# Patient Record
Sex: Female | Born: 1970 | Race: White | Hispanic: No | Marital: Married | Smoking: Never smoker
Health system: Southern US, Community
[De-identification: ages and names within clinical notes are randomized; demographics above are authoritative.]

## PROBLEM LIST (undated history)

## (undated) DIAGNOSIS — M1990 Unspecified osteoarthritis, unspecified site: Secondary | ICD-10-CM

## (undated) DIAGNOSIS — Z973 Presence of spectacles and contact lenses: Secondary | ICD-10-CM

## (undated) DIAGNOSIS — T8859XA Other complications of anesthesia, initial encounter: Secondary | ICD-10-CM

## (undated) DIAGNOSIS — I82409 Acute embolism and thrombosis of unspecified deep veins of unspecified lower extremity: Secondary | ICD-10-CM

## (undated) HISTORY — PX: GALLBLADDER SURGERY: SHX652

## (undated) HISTORY — PX: CHOLECYSTECTOMY: SHX55

---

## 2000-02-24 HISTORY — PX: VEIN REPAIR: SHX6524

## 2008-12-20 ENCOUNTER — Ambulatory Visit: Payer: Self-pay

## 2010-11-13 ENCOUNTER — Ambulatory Visit: Payer: Self-pay | Admitting: Internal Medicine

## 2010-11-19 ENCOUNTER — Ambulatory Visit: Payer: Self-pay | Admitting: Surgery

## 2011-01-21 ENCOUNTER — Ambulatory Visit: Payer: Self-pay | Admitting: Anesthesiology

## 2011-01-29 ENCOUNTER — Ambulatory Visit: Payer: Self-pay | Admitting: Otolaryngology

## 2011-01-29 HISTORY — PX: SEPTOPLASTY: SUR1290

## 2012-03-11 ENCOUNTER — Ambulatory Visit: Payer: Self-pay

## 2013-03-17 ENCOUNTER — Ambulatory Visit: Payer: Self-pay

## 2014-03-27 ENCOUNTER — Ambulatory Visit: Payer: Self-pay

## 2014-12-13 DIAGNOSIS — Z3041 Encounter for surveillance of contraceptive pills: Secondary | ICD-10-CM | POA: Insufficient documentation

## 2014-12-13 DIAGNOSIS — Z1211 Encounter for screening for malignant neoplasm of colon: Secondary | ICD-10-CM | POA: Insufficient documentation

## 2014-12-13 DIAGNOSIS — R635 Abnormal weight gain: Secondary | ICD-10-CM | POA: Insufficient documentation

## 2014-12-13 DIAGNOSIS — Z01419 Encounter for gynecological examination (general) (routine) without abnormal findings: Secondary | ICD-10-CM | POA: Insufficient documentation

## 2014-12-13 DIAGNOSIS — N63 Unspecified lump in unspecified breast: Secondary | ICD-10-CM | POA: Insufficient documentation

## 2014-12-24 ENCOUNTER — Ambulatory Visit: Payer: Self-pay | Attending: Oncology | Admitting: *Deleted

## 2014-12-24 ENCOUNTER — Ambulatory Visit
Admission: RE | Admit: 2014-12-24 | Discharge: 2014-12-24 | Disposition: A | Discharge status: Home / Self Care | Payer: Self-pay | Source: Ambulatory Visit | Attending: Oncology | Admitting: Oncology

## 2014-12-24 ENCOUNTER — Encounter: Payer: Self-pay | Admitting: *Deleted

## 2014-12-24 VITALS — BP 129/80 | HR 83 | Temp 98.7°F | Resp 18 | Ht 67.72 in | Wt 151.0 lb

## 2014-12-24 DIAGNOSIS — N63 Unspecified lump in unspecified breast: Secondary | ICD-10-CM

## 2014-12-24 NOTE — Patient Instructions (Signed)
Gave patient hand-out, Women Staying Healthy, Active and Well from [facility], with education on breast health, pap smears, heart and colon health. 

## 2014-12-24 NOTE — Progress Notes (Signed)
Subjective:     Patient ID: Mia Church, female   DOB: 07/12/1970, 44 y.o.   MRN: [id number]  HPI   Review of Systems     Objective:   Physical Exam  Pulmonary/Chest: Right breast exhibits no inverted nipple, no mass, no nipple discharge, no skin change and no tenderness. Left breast exhibits mass. Left breast exhibits no inverted nipple, no nipple discharge, no skin change and no tenderness. Breasts are symmetrical.         Assessment:     44 year [facility] White [facility] a left breast mass.  Last mammogram on 03/27/14 was a birads 1.  It was noted that the patient has heterogeneously dense breast on mammogram.  On clinical breast exam there is noted an asymmetrical thickening at 1-2:00 left breast.  No family history of breast or ovarian cancer.  Patient states that maybe her great aunt had breast cancer in her 4990's.  Taught self breast awareness.  Patient has been screened for eligibility.  She does not have any Medicare or Medicaid, but has a 10,000 dollar deductable. She also meets financial eligibility.  Hand-out given on the Affordable Care Act.    Plan:     Will get uni left mammogram with ultrasound.  If no findings on imaging discussed with the patient the option to see a surgeon for a second opinion.  Will follow up per protocol.

## 2014-12-25 ENCOUNTER — Telehealth: Payer: Self-pay | Admitting: *Deleted

## 2014-12-25 NOTE — Telephone Encounter (Signed)
Talked to patient today to review her normal birads 1 mammogram.  Offered surgical consult, but the patient declined.  States she is comfortable with the radiology recommendation.  Encouraged  Her to call me back if she notices any changes or decides she would like further evaluation by a surgeon.  She is agreeable.

## 2014-12-26 ENCOUNTER — Other Ambulatory Visit: Payer: Self-pay | Admitting: Obstetrics and Gynecology

## 2014-12-26 ENCOUNTER — Ambulatory Visit: Payer: Self-pay

## 2014-12-26 DIAGNOSIS — Z1231 Encounter for screening mammogram for malignant neoplasm of breast: Secondary | ICD-10-CM

## 2015-02-28 ENCOUNTER — Ambulatory Visit
Admission: RE | Admit: 2015-02-28 | Discharge: 2015-02-28 | Disposition: A | Discharge status: Home / Self Care | Payer: BLUE CROSS/BLUE SHIELD | Source: Ambulatory Visit | Attending: Obstetrics and Gynecology | Admitting: Obstetrics and Gynecology

## 2015-02-28 DIAGNOSIS — Z1231 Encounter for screening mammogram for malignant neoplasm of breast: Secondary | ICD-10-CM

## 2016-02-26 ENCOUNTER — Other Ambulatory Visit: Payer: Self-pay | Admitting: Obstetrics and Gynecology

## 2016-02-26 DIAGNOSIS — Z1231 Encounter for screening mammogram for malignant neoplasm of breast: Secondary | ICD-10-CM

## 2016-03-30 ENCOUNTER — Ambulatory Visit
Admission: RE | Admit: 2016-03-30 | Discharge: 2016-03-30 | Disposition: A | Discharge status: Home / Self Care | Payer: BLUE CROSS/BLUE SHIELD | Source: Ambulatory Visit | Attending: Obstetrics and Gynecology | Admitting: Obstetrics and Gynecology

## 2016-03-30 DIAGNOSIS — Z1231 Encounter for screening mammogram for malignant neoplasm of breast: Secondary | ICD-10-CM | POA: Insufficient documentation

## 2016-10-12 ENCOUNTER — Other Ambulatory Visit: Payer: Self-pay | Admitting: Obstetrics and Gynecology

## 2016-10-12 DIAGNOSIS — Z1231 Encounter for screening mammogram for malignant neoplasm of breast: Secondary | ICD-10-CM

## 2017-02-25 ENCOUNTER — Ambulatory Visit
Admission: RE | Admit: 2017-02-25 | Discharge: 2017-02-25 | Disposition: A | Discharge status: Home / Self Care | Payer: BLUE CROSS/BLUE SHIELD | Source: Ambulatory Visit | Attending: Obstetrics and Gynecology | Admitting: Obstetrics and Gynecology

## 2017-02-25 DIAGNOSIS — Z1231 Encounter for screening mammogram for malignant neoplasm of breast: Secondary | ICD-10-CM | POA: Diagnosis not present

## 2017-11-25 ENCOUNTER — Other Ambulatory Visit: Payer: Self-pay | Admitting: Obstetrics and Gynecology

## 2017-11-25 DIAGNOSIS — Z1231 Encounter for screening mammogram for malignant neoplasm of breast: Secondary | ICD-10-CM

## 2018-02-24 ENCOUNTER — Ambulatory Visit
Admission: RE | Admit: 2018-02-24 | Discharge: 2018-02-24 | Disposition: A | Discharge status: Home / Self Care | Payer: BLUE CROSS/BLUE SHIELD | Source: Ambulatory Visit | Attending: Obstetrics and Gynecology | Admitting: Obstetrics and Gynecology

## 2018-02-24 DIAGNOSIS — Z1231 Encounter for screening mammogram for malignant neoplasm of breast: Secondary | ICD-10-CM | POA: Diagnosis present

## 2018-03-01 ENCOUNTER — Other Ambulatory Visit: Payer: Self-pay | Admitting: Obstetrics and Gynecology

## 2018-03-01 DIAGNOSIS — R928 Other abnormal and inconclusive findings on diagnostic imaging of breast: Secondary | ICD-10-CM

## 2018-03-01 DIAGNOSIS — N6489 Other specified disorders of breast: Secondary | ICD-10-CM

## 2018-03-03 ENCOUNTER — Ambulatory Visit
Admission: RE | Admit: 2018-03-03 | Discharge: 2018-03-03 | Disposition: A | Discharge status: Home / Self Care | Payer: BLUE CROSS/BLUE SHIELD | Source: Ambulatory Visit | Attending: Obstetrics and Gynecology | Admitting: Obstetrics and Gynecology

## 2018-03-03 DIAGNOSIS — R928 Other abnormal and inconclusive findings on diagnostic imaging of breast: Secondary | ICD-10-CM | POA: Insufficient documentation

## 2018-03-03 DIAGNOSIS — N6489 Other specified disorders of breast: Secondary | ICD-10-CM

## 2019-02-06 ENCOUNTER — Other Ambulatory Visit: Payer: Self-pay | Admitting: Obstetrics and Gynecology

## 2019-02-06 DIAGNOSIS — Z1231 Encounter for screening mammogram for malignant neoplasm of breast: Secondary | ICD-10-CM

## 2019-03-01 ENCOUNTER — Ambulatory Visit
Admission: RE | Admit: 2019-03-01 | Discharge: 2019-03-01 | Disposition: A | Discharge status: Home / Self Care | Payer: BC Managed Care – PPO | Source: Ambulatory Visit | Attending: Obstetrics and Gynecology | Admitting: Obstetrics and Gynecology

## 2019-03-01 DIAGNOSIS — Z1231 Encounter for screening mammogram for malignant neoplasm of breast: Secondary | ICD-10-CM | POA: Diagnosis present

## 2019-03-07 ENCOUNTER — Other Ambulatory Visit: Payer: Self-pay | Admitting: Obstetrics and Gynecology

## 2019-03-07 DIAGNOSIS — N6489 Other specified disorders of breast: Secondary | ICD-10-CM

## 2019-03-07 DIAGNOSIS — R928 Other abnormal and inconclusive findings on diagnostic imaging of breast: Secondary | ICD-10-CM

## 2019-03-10 ENCOUNTER — Ambulatory Visit
Admission: RE | Admit: 2019-03-10 | Discharge: 2019-03-10 | Disposition: A | Discharge status: Home / Self Care | Payer: BC Managed Care – PPO | Source: Ambulatory Visit | Attending: Obstetrics and Gynecology | Admitting: Obstetrics and Gynecology

## 2019-03-10 DIAGNOSIS — R928 Other abnormal and inconclusive findings on diagnostic imaging of breast: Secondary | ICD-10-CM | POA: Insufficient documentation

## 2019-03-10 DIAGNOSIS — N6489 Other specified disorders of breast: Secondary | ICD-10-CM | POA: Diagnosis present

## 2019-08-08 ENCOUNTER — Encounter: Payer: Self-pay | Admitting: Podiatry

## 2019-08-09 ENCOUNTER — Other Ambulatory Visit: Payer: Self-pay | Admitting: Podiatry

## 2019-08-15 ENCOUNTER — Other Ambulatory Visit: Payer: Self-pay

## 2019-08-15 ENCOUNTER — Other Ambulatory Visit
Admission: RE | Admit: 2019-08-15 | Discharge: 2019-08-15 | Disposition: A | Discharge status: Home / Self Care | Payer: BC Managed Care – PPO | Source: Ambulatory Visit | Attending: Podiatry | Admitting: Podiatry

## 2019-08-15 DIAGNOSIS — Z01812 Encounter for preprocedural laboratory examination: Secondary | ICD-10-CM | POA: Diagnosis not present

## 2019-08-15 DIAGNOSIS — Z20822 Contact with and (suspected) exposure to covid-19: Secondary | ICD-10-CM | POA: Diagnosis not present

## 2019-08-15 LAB — SARS CORONAVIRUS 2 (TAT 6-24 HRS): SARS Coronavirus 2: NEGATIVE

## 2019-08-15 NOTE — Discharge Instructions (Signed)
[facility]  POST OPERATIVE INSTRUCTIONS FOR DR. TROXLER, DR. FOWLER, AND DR. B[facility] PODIATRY DEPART[facility]   1. Take your medication as prescribed.  Pain medication should be taken only as needed.  2. Keep the dressing clean, dry and intact.  3. Keep your foot elevated above the heart level for the first 48 hours.  4. Walking to the bathroom and brief periods of walking are acceptable, unless we have instructed you to be non-weight bearing.  5. Always wear your post-op shoe when walking.  Always use your crutches if you are to be non-weight bearing.  6. Do not take a shower. Baths are permissible as long as the foot is kept out of the water.   7. Every hour you are awake:  - Bend your knee 15 times. - Flex foot 15 times - Massage calf 15 times  8. Call [facility] ([phone]) if any of the following problems occur: - You develop a temperature or fever. - The bandage becomes saturated with blood. - Medication does not stop your pain. - Injury of the foot occurs. - Any symptoms of infection including redness, odor, or red streaks running from wound.   General Anesthesia, Adult, Care After This sheet gives you information about how to care for yourself after your procedure. Your health care provider may also give you more specific instructions. If you have problems or questions, contact your health care provider. What can I expect after the procedure? After the procedure, the following side effects are common:  Pain or discomfort at the IV site.  Nausea.  Vomiting.  Sore throat.  Trouble concentrating.  Feeling cold or chills.  Weak or tired.  Sleepiness and fatigue.  Soreness and body aches. These side effects can affect parts of the body that were not involved in surgery. Follow these instructions at home:  For at least 24 hours after the procedure:  Have a responsible adult stay with you. It is  important to have someone help care for you until you are awake and alert.  Rest as needed.  Do not: ? Participate in activities in which you could fall or become injured. ? Drive. ? Use heavy machinery. ? Drink alcohol. ? Take sleeping pills or medicines that cause drowsiness. ? Make important decisions or sign legal documents. ? Take care of children on your own. Eating and drinking  Follow any instructions from your health care provider about eating or drinking restrictions.  When you feel hungry, start by eating small amounts of foods that are soft and easy to digest (bland), such as toast. Gradually return to your regular diet.  Drink enough fluid to keep your urine pale yellow.  If you vomit, rehydrate by drinking water, juice, or clear broth. General instructions  If you have sleep apnea, surgery and certain medicines can increase your risk for breathing problems. Follow instructions from your health care provider about wearing your sleep device: ? Anytime you are sleeping, including during daytime naps. ? While taking prescription pain medicines, sleeping medicines, or medicines that make you drowsy.  Return to your normal activities as told by your health care provider. Ask your health care provider what activities are safe for you.  Take over-the-counter and prescription medicines only as told by your health care provider.  If you smoke, do not smoke without supervision.  Keep all follow-up visits as told by your health care provider. This is important. Contact a health care provider if:    You have nausea or vomiting that does not get better with medicine.  You cannot eat or drink without vomiting.  You have pain that does not get better with medicine.  You are unable to pass urine.  You develop a skin rash.  You have a fever.  You have redness around your IV site that gets worse. Get help right away if:  You have difficulty breathing.  You have chest  pain.  You have blood in your urine or stool, or you vomit blood. Summary  After the procedure, it is common to have a sore throat or nausea. It is also common to feel tired.  Have a responsible adult stay with you for the first 24 hours after general anesthesia. It is important to have someone help care for you until you are awake and alert.  When you feel hungry, start by eating small amounts of foods that are soft and easy to digest (bland), such as toast. Gradually return to your regular diet.  Drink enough fluid to keep your urine pale yellow.  Return to your normal activities as told by your health care provider. Ask your health care provider what activities are safe for you. This information is not intended to replace advice given to you by your health care provider. Make sure you discuss any questions you have with your health care provider. Document Revised: 02/12/2017 Document Reviewed: 09/25/2016 Elsevier Patient Education  2020 [employer]  

## 2019-08-17 ENCOUNTER — Encounter
Admission: RE | Disposition: A | Discharge status: Home / Self Care | Payer: Self-pay | Source: Home / Self Care | Attending: Podiatry

## 2019-08-17 ENCOUNTER — Encounter: Payer: Self-pay | Admitting: Podiatry

## 2019-08-17 ENCOUNTER — Ambulatory Visit
Admission: RE | Admit: 2019-08-17 | Discharge: 2019-08-17 | Disposition: A | Discharge status: Home / Self Care | Payer: BC Managed Care – PPO | Attending: Podiatry | Admitting: Podiatry

## 2019-08-17 ENCOUNTER — Ambulatory Visit: Payer: BC Managed Care – PPO | Admitting: Anesthesiology

## 2019-08-17 ENCOUNTER — Other Ambulatory Visit: Payer: Self-pay

## 2019-08-17 DIAGNOSIS — G8929 Other chronic pain: Secondary | ICD-10-CM | POA: Insufficient documentation

## 2019-08-17 DIAGNOSIS — Z79899 Other long term (current) drug therapy: Secondary | ICD-10-CM | POA: Insufficient documentation

## 2019-08-17 DIAGNOSIS — Q666 Other congenital valgus deformities of feet: Secondary | ICD-10-CM | POA: Diagnosis not present

## 2019-08-17 DIAGNOSIS — Z9049 Acquired absence of other specified parts of digestive tract: Secondary | ICD-10-CM | POA: Diagnosis not present

## 2019-08-17 DIAGNOSIS — M2100 Valgus deformity, not elsewhere classified, unspecified site: Secondary | ICD-10-CM | POA: Diagnosis present

## 2019-08-17 HISTORY — DX: Unspecified osteoarthritis, unspecified site: M19.90

## 2019-08-17 HISTORY — PX: WEIL OSTEOTOMY: SHX5044

## 2019-08-17 HISTORY — DX: Presence of spectacles and contact lenses: Z97.3

## 2019-08-17 HISTORY — DX: Other complications of anesthesia, initial encounter: T88.59XA

## 2019-08-17 LAB — POCT PREGNANCY, URINE: Preg Test, Ur: NEGATIVE

## 2019-08-17 SURGERY — OSTEOTOMY, WEIL
Anesthesia: General | Laterality: Left

## 2019-08-17 MED ORDER — LACTATED RINGERS IV SOLN: INTRAVENOUS | Status: DC

## 2019-08-17 MED ORDER — ONDANSETRON HCL 4 MG/2ML IJ SOLN
INTRAMUSCULAR | Status: DC | PRN
  Administered 2019-08-17: 4 mg via INTRAVENOUS

## 2019-08-17 MED ORDER — BUPIVACAINE LIPOSOME 1.3 % IJ SUSP
INTRAMUSCULAR | Status: DC | PRN
  Administered 2019-08-17: 5 mL

## 2019-08-17 MED ORDER — OXYCODONE HCL 5 MG/5ML PO SOLN: 5.0000 mg | Freq: Once | ORAL | Status: DC | PRN

## 2019-08-17 MED ORDER — OXYCODONE HCL 5 MG PO TABS: 5.0000 mg | ORAL_TABLET | Freq: Once | ORAL | Status: DC | PRN

## 2019-08-17 MED ORDER — BUPIVACAINE HCL 0.25 % IJ SOLN
INTRAMUSCULAR | Status: DC | PRN
  Administered 2019-08-17: 5 mL

## 2019-08-17 MED ORDER — POVIDONE-IODINE 7.5 % EX SOLN: Freq: Once | CUTANEOUS | Status: AC

## 2019-08-17 MED ORDER — CEFAZOLIN SODIUM-DEXTROSE 2-4 GM/100ML-% IV SOLN
2.0000 g | INTRAVENOUS | Status: AC
  Administered 2019-08-17: 2 g via INTRAVENOUS

## 2019-08-17 MED ORDER — FENTANYL CITRATE (PF) 100 MCG/2ML IJ SOLN: 25.0000 ug | INTRAMUSCULAR | Status: DC | PRN

## 2019-08-17 MED ORDER — LIDOCAINE HCL (CARDIAC) PF 100 MG/5ML IV SOSY
PREFILLED_SYRINGE | INTRAVENOUS | Status: DC | PRN
  Administered 2019-08-17: 20 mg via INTRATRACHEAL

## 2019-08-17 MED ORDER — PROPOFOL 10 MG/ML IV BOLUS
INTRAVENOUS | Status: DC | PRN
  Administered 2019-08-17: 160 mg via INTRAVENOUS

## 2019-08-17 MED ORDER — DEXAMETHASONE SODIUM PHOSPHATE 4 MG/ML IJ SOLN
INTRAMUSCULAR | Status: DC | PRN
  Administered 2019-08-17: 4 mg via INTRAVENOUS

## 2019-08-17 MED ORDER — MIDAZOLAM HCL 5 MG/5ML IJ SOLN
INTRAMUSCULAR | Status: DC | PRN
  Administered 2019-08-17 (×2): 1 mg via INTRAVENOUS

## 2019-08-17 MED ORDER — OXYCODONE-ACETAMINOPHEN 7.5-325 MG PO TABS: 1.0000 | ORAL_TABLET | ORAL | 0 refills | Status: DC | PRN

## 2019-08-17 SURGICAL SUPPLY — 55 items
BANDAGE ELASTIC 4 VELCRO NS (GAUZE/BANDAGES/DRESSINGS) ×2 IMPLANT
BENZOIN TINCTURE PRP APPL 2/3 (GAUZE/BANDAGES/DRESSINGS) ×2 IMPLANT
BIT DRILL 1.7 LNG CANN (DRILL) ×2 IMPLANT
BLADE MINI RND TIP GREEN BEAV (BLADE) IMPLANT
BLADE OSC/SAGITTAL MD 5.5X18 (BLADE) IMPLANT
BLADE OSC/SAGITTAL MD 9X18.5 (BLADE) ×2 IMPLANT
BNDG ESMARK 4X12 TAN STRL LF (GAUZE/BANDAGES/DRESSINGS) ×2 IMPLANT
BNDG GAUZE 4.5X4.1 6PLY STRL (MISCELLANEOUS) ×2 IMPLANT
BNDG STRETCH 4X75 STRL LF (GAUZE/BANDAGES/DRESSINGS) ×2 IMPLANT
CANISTER SUCT 1200ML W/VALVE (MISCELLANEOUS) ×2 IMPLANT
CAST PADDING 3X4FT ST 30246 (SOFTGOODS)
CNTRSNK DRL 2 HDLS SCR (MISCELLANEOUS) ×1 IMPLANT
COUNTERSINK 2.0 (MISCELLANEOUS) ×1
COVER LIGHT HANDLE UNIVERSAL (MISCELLANEOUS) ×4 IMPLANT
COVER PIN YLW 0.028-062 (MISCELLANEOUS) IMPLANT
CUFF TOURN SGL QUICK 18X4 (TOURNIQUET CUFF) IMPLANT
DRAPE FLUOR MINI C-ARM 54X84 (DRAPES) ×2 IMPLANT
DURAPREP 26ML APPLICATOR (WOUND CARE) ×2 IMPLANT
ELECT REM PT RETURN 9FT ADLT (ELECTROSURGICAL) ×2
ELECTRODE REM PT RTRN 9FT ADLT (ELECTROSURGICAL) ×1 IMPLANT
GAUZE SPONGE 4X4 12PLY STRL (GAUZE/BANDAGES/DRESSINGS) ×2 IMPLANT
GAUZE XEROFORM 1X8 LF (GAUZE/BANDAGES/DRESSINGS) ×2 IMPLANT
GLOVE BIO SURGEON STRL SZ8 (GLOVE) ×2 IMPLANT
GOWN STRL REUS W/ TWL LRG LVL3 (GOWN DISPOSABLE) ×1 IMPLANT
GOWN STRL REUS W/ TWL XL LVL3 (GOWN DISPOSABLE) ×1 IMPLANT
GOWN STRL REUS W/TWL LRG LVL3 (GOWN DISPOSABLE) ×1
GOWN STRL REUS W/TWL XL LVL3 (GOWN DISPOSABLE) ×1
K-WIRE .9X150 (WIRE) ×2
K-WIRE DBL END TROCAR 6X.045 (WIRE)
K-WIRE DBL END TROCAR 6X.062 (WIRE)
KIT TURNOVER KIT A (KITS) ×2 IMPLANT
KWIRE .9X150 (WIRE) ×1 IMPLANT
KWIRE DBL END TROCAR 6X.045 (WIRE) IMPLANT
KWIRE DBL END TROCAR 6X.062 (WIRE) IMPLANT
NEEDLE HYPO 18GX1.5 BLUNT FILL (NEEDLE) IMPLANT
NEEDLE HYPO 25GX1X1/2 BEV (NEEDLE) IMPLANT
NS IRRIG 500ML POUR BTL (IV SOLUTION) ×2 IMPLANT
PACK EXTREMITY ARMC (MISCELLANEOUS) ×2 IMPLANT
PAD CAST CTTN 3X4 STRL (SOFTGOODS) IMPLANT
PENCIL SMOKE EVACUATOR (MISCELLANEOUS) ×2 IMPLANT
RASP SM TEAR CROSS CUT (RASP) ×2 IMPLANT
SCREW HEADLESS 2.0X14MM (Screw) ×4 IMPLANT
SPLINT CAST 1 STEP 4X30 (MISCELLANEOUS) ×2 IMPLANT
SPLINT FAST PLASTER 5X30 (CAST SUPPLIES)
SPLINT PLASTER CAST FAST 5X30 (CAST SUPPLIES) IMPLANT
STOCKINETTE STRL 6IN 960660 (GAUZE/BANDAGES/DRESSINGS) ×2 IMPLANT
STRAP BODY AND KNEE 60X3 (MISCELLANEOUS) ×2 IMPLANT
STRIP CLOSURE SKIN 1/4X4 (GAUZE/BANDAGES/DRESSINGS) ×2 IMPLANT
SUT VIC AB 2-0 SH 27 (SUTURE)
SUT VIC AB 2-0 SH 27XBRD (SUTURE) IMPLANT
SUT VIC AB 3-0 SH 27 (SUTURE)
SUT VIC AB 3-0 SH 27X BRD (SUTURE) IMPLANT
SUT VIC AB 4-0 FS2 27 (SUTURE) ×2 IMPLANT
SUT VICRYL AB 3-0 FS1 BRD 27IN (SUTURE) IMPLANT
SYR 10ML LL (SYRINGE) IMPLANT

## 2019-08-17 NOTE — Anesthesia Preprocedure Evaluation (Signed)
Anesthesia Evaluation  Patient identified by MRN, date of birth, ID band Patient awake    Reviewed: Allergy & Precautions, H&P , NPO status , Patient's Chart, lab work & pertinent test results  History of Anesthesia Complications (+) PONV and history of anesthetic complications  Airway Mallampati: I  TM Distance: >3 FB Neck ROM: full    Dental no notable dental hx.    Pulmonary neg pulmonary ROS,    Pulmonary exam normal breath sounds clear to auscultation       Cardiovascular Normal cardiovascular exam Rhythm:regular Rate:Normal     Neuro/Psych    GI/Hepatic negative GI ROS, Neg liver ROS,   Endo/Other  negative endocrine ROS  Renal/GU negative Renal ROS  negative genitourinary   Musculoskeletal   Abdominal   Peds  Hematology negative hematology ROS (+)   Anesthesia Other Findings   Reproductive/Obstetrics                             Anesthesia Physical Anesthesia Plan  ASA: I  Anesthesia Plan: General LMA   Post-op Pain Management:    Induction:   PONV Risk Score and Plan:   Airway Management Planned:   Additional Equipment:   Intra-op Plan:   Post-operative Plan:   Informed Consent: I have reviewed the patients History and Physical, chart, labs and discussed the procedure including the risks, benefits and alternatives for the proposed anesthesia with the patient or authorized representative who has indicated his/her understanding and acceptance.       Plan Discussed with:   Anesthesia Plan Comments:         Anesthesia Quick Evaluation

## 2019-08-17 NOTE — Anesthesia Postprocedure Evaluation (Signed)
Anesthesia Post Note  Patient: Mia Church  Procedure(s) Performed: WEIL OSTEOTOMY 5TH LEFT (Left )     Patient location during evaluation: PACU Anesthesia Type: General Level of consciousness: awake and alert Pain management: pain level controlled Vital Signs Assessment: post-procedure vital signs reviewed and stable Respiratory status: spontaneous breathing Cardiovascular status: stable Anesthetic complications: no   No complications documented.  Marvis Repress

## 2019-08-17 NOTE — Anesthesia Procedure Notes (Signed)
Procedure Name: LMA Insertion Date/Time: 08/17/2019 8:51 AM Performed by: Maree Krabbe, CRNA Pre-anesthesia Checklist: Patient identified, Emergency Drugs available, Suction available, Timeout performed and Patient being monitored Patient Re-evaluated:Patient Re-evaluated prior to induction Oxygen Delivery Method: Circle system utilized Preoxygenation: Pre-oxygenation with 100% oxygen Induction Type: IV induction LMA: LMA inserted LMA Size: 4.0 Number of attempts: 1 Placement Confirmation: positive ETCO2 and breath sounds checked- equal and bilateral Tube secured with: Tape Dental Injury: Teeth and Oropharynx as per pre-operative assessment

## 2019-08-17 NOTE — H&P (Signed)
H and P has been reviewed and no changes are noted.  

## 2019-08-17 NOTE — Transfer of Care (Signed)
Immediate Anesthesia Transfer of Care Note  Patient: Mia Church  Procedure(s) Performed: WEIL OSTEOTOMY 5TH LEFT (Left )  Patient Location: PACU  Anesthesia Type: General LMA  Level of Consciousness: awake, alert  and patient cooperative  Airway and Oxygen Therapy: Patient Spontanous Breathing and Patient connected to supplemental oxygen  Post-op Assessment: Post-op Vital signs reviewed, Patient's Cardiovascular Status Stable, Respiratory Function Stable, Patent Airway and No signs of Nausea or vomiting  Post-op Vital Signs: Reviewed and stable  Complications: No complications documented.

## 2019-08-17 NOTE — Op Note (Signed)
Operative note   Surgeon: Dr. Recardo Evangelist, DPM.    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  Osteotomy fifth metatarsal left foot with 2 screw fixation          EBL: Less than 5 cc    Anesthesia:general delivered by the anesthesia team.  I delivered preoperative local anesthetic with a combination of .25% Marcaine plain mixed with Exparel total of 10 cc    Hemostasis: Ankle tourniquet 35 minutes    Specimen: None    Complications: None    Operative indications: Chronic pain left fifth metatarsal resistant to conservative care    Procedure:  Patient was brought into the OR and placed on the operating table in thesupine position. After anesthesia was obtained theleft lower extremity was prepped and draped in usual sterile fashion.  Operative Report: This time attention directed to the fifth metatarsophalangeal joint where a 3.5 cm linear incision made over this region and deepened with sharp and blunt dissection.  Tissue was identified and incised longitudinally reflected medially and laterally from the fifth metatarsal head.  A prominence of bone was noted on third metatarsal head dorsal laterally.  This was resected and rasped smoothly.  This point osteotomy was performed in the metatarsal at the osteochondral junction from dorsal distal plantar proximal.  The head of the metatarsal was then shifted to a more medial position temporarily fixated with 2 K wires.  There is checked FluoroScan good position correction were noted.  214 mm headless screws from the mini monster set were then placed across the area with good stability and good fixation.  The remaining lateral shelf of bone was then resected and rasped smoothly. After copious irrigation the capsular periosteal tissue was then closed with 4-0 Vicryl in a continuous stitch.  Deep superficial fascial layers were also closed with 4-0 Vicryl in a  continuous stitch.  Skin was closed with 4-0 Vicryl subcuticular stitch.  At this time a sterile compressive dressing was placed across wound consisting of Steri-Strips Xeroform gauze 4 x 4's Kling and Kerlix.  A posterior splint was placed on the left foot leg in the operating room.    Patient tolerated the procedure and anesthesia well.  Was transported from the OR to the PACU with all vital signs stable and vascular status intact. To be discharged per routine protocol.  Will follow up in approximately 1 week in the outpatient clinic.

## 2019-08-18 ENCOUNTER — Encounter: Payer: Self-pay | Admitting: Podiatry

## 2019-08-29 ENCOUNTER — Other Ambulatory Visit: Payer: Self-pay | Admitting: Podiatry

## 2019-08-29 ENCOUNTER — Other Ambulatory Visit (HOSPITAL_COMMUNITY): Payer: Self-pay | Admitting: Podiatry

## 2019-08-29 DIAGNOSIS — M79662 Pain in left lower leg: Secondary | ICD-10-CM

## 2019-08-30 ENCOUNTER — Other Ambulatory Visit: Payer: Self-pay

## 2019-08-30 ENCOUNTER — Emergency Department
Admission: EM | Admit: 2019-08-30 | Discharge: 2019-08-30 | Disposition: A | Discharge status: Home / Self Care | Payer: BC Managed Care – PPO | Attending: Emergency Medicine | Admitting: Emergency Medicine

## 2019-08-30 ENCOUNTER — Ambulatory Visit
Admission: RE | Admit: 2019-08-30 | Discharge: 2019-08-30 | Disposition: A | Discharge status: Home / Self Care | Payer: BC Managed Care – PPO | Source: Ambulatory Visit | Attending: Podiatry | Admitting: Podiatry

## 2019-08-30 DIAGNOSIS — M79662 Pain in left lower leg: Secondary | ICD-10-CM

## 2019-08-30 DIAGNOSIS — M7989 Other specified soft tissue disorders: Secondary | ICD-10-CM | POA: Insufficient documentation

## 2019-08-30 DIAGNOSIS — I82402 Acute embolism and thrombosis of unspecified deep veins of left lower extremity: Secondary | ICD-10-CM | POA: Diagnosis not present

## 2019-08-30 DIAGNOSIS — I82432 Acute embolism and thrombosis of left popliteal vein: Secondary | ICD-10-CM

## 2019-08-30 LAB — CBC
HCT: 36.7 % (ref 36.0–46.0)
Hemoglobin: 13 g/dL (ref 12.0–15.0)
MCH: 31.9 pg (ref 26.0–34.0)
MCHC: 35.4 g/dL (ref 30.0–36.0)
MCV: 90.2 fL (ref 80.0–100.0)
Platelets: 375 10*3/uL (ref 150–400)
RBC: 4.07 MIL/uL (ref 3.87–5.11)
RDW: 12.1 % (ref 11.5–15.5)
WBC: 12.1 10*3/uL — ABNORMAL HIGH (ref 4.0–10.5)
nRBC: 0 % (ref 0.0–0.2)

## 2019-08-30 LAB — PROTIME-INR
INR: 1 (ref 0.8–1.2)
Prothrombin Time: 13.1 seconds (ref 11.4–15.2)

## 2019-08-30 LAB — APTT: aPTT: 28 seconds (ref 24–36)

## 2019-08-30 LAB — BASIC METABOLIC PANEL
Anion gap: 10 (ref 5–15)
BUN: 11 mg/dL (ref 6–20)
CO2: 24 mmol/L (ref 22–32)
Calcium: 9.1 mg/dL (ref 8.9–10.3)
Chloride: 105 mmol/L (ref 98–111)
Creatinine, Ser: 0.64 mg/dL (ref 0.44–1.00)
GFR calc Af Amer: >60 mL/min (ref >60)
GFR calc non Af Amer: >60 mL/min (ref >60)
Glucose, Bld: 113 mg/dL — ABNORMAL HIGH (ref 70–99)
Potassium: 3.6 mmol/L (ref 3.5–5.1)
Sodium: 139 mmol/L (ref 135–145)

## 2019-08-30 LAB — HEPATIC FUNCTION PANEL
ALT: 15 U/L (ref 0–44)
AST: 16 U/L (ref 15–41)
Albumin: 3.9 g/dL (ref 3.5–5.0)
Alkaline Phosphatase: 73 U/L (ref 38–126)
Bilirubin, Direct: 0.1 mg/dL (ref 0.0–0.2)
Indirect Bilirubin: 0.6 mg/dL (ref 0.3–0.9)
Total Bilirubin: 0.7 mg/dL (ref 0.3–1.2)
Total Protein: 7.9 g/dL (ref 6.5–8.1)

## 2019-08-30 MED ORDER — RIVAROXABAN 15 MG PO TABS
15.0000 mg | ORAL_TABLET | Freq: Once | ORAL | Status: AC
  Administered 2019-08-30: 15 mg via ORAL
  Filled 2019-08-30: qty 1

## 2019-08-30 MED ORDER — RIVAROXABAN (XARELTO) VTE STARTER PACK (15 & 20 MG)
ORAL_TABLET | ORAL | 0 refills | Status: DC
Start: 2019-08-30 — End: 2020-03-18

## 2019-08-30 NOTE — ED Notes (Signed)
Arrives from [place], + DVT.  AAOx3.  Skin warm and dry. NAD

## 2019-08-30 NOTE — ED Triage Notes (Signed)
Pt arrives via POV for reports of DVT in left lower leg per radiology report. Pt states she had bunion surgery on the leg on 06/24 and her leg started aching on Saturday. Pt has boot and ace wrap to left leg. Pt does not have a PCP so was told to come to the ER. Pt in NAD, skin warm and dry.

## 2019-08-30 NOTE — ED Provider Notes (Signed)
[facility] Emergency Department Provider Note   ____________________________________________    I have reviewed the triage vital signs and the nursing notes.   HISTORY  Chief Complaint DVT     HPI Mia Church is a 49 y.o. female with a history as noted below who presents with complaints of pain in her left calf.  Patient recently had foot surgery, several days ago developed mild pain in calf, relieved with ibuprofen.  Pain worsens but over the last 1 to 2 days has improved.  Did have ultrasound performed today which demonstrated DVT and was sent to the emergency department.  No pleurisy no shortness of breath.  Is on birth control hormones.  Past Medical History:  Diagnosis Date  . Arthritis    osteoarthritis right hand  . Complication of anesthesia    N & V with gallbladder surgery  . Wears contact lenses     There are no problems to display for this patient.   Past Surgical History:  Procedure Laterality Date  . CHOLECYSTECTOMY     2012  . GALLBLADDER SURGERY    . SEPTOPLASTY  01/29/2011  . VEIN REPAIR  2002   legs   . WEIL OSTEOTOMY Left 08/17/2019   Procedure: WEIL OSTEOTOMY 5TH LEFT;  Surgeon: Recardo Evangelist, DPM;  Location: [facility];  Service: Podiatry;  Laterality: Left;  LMA LOCAL    Prior to Admission medications   Medication Sig Start Date End Date Taking? Authorizing Provider  cholecalciferol (VITAMIN D3) 25 MCG (1000 UNIT) tablet Take 1,000 Units by mouth daily.    [provider]  Glucosamine-Chondroit-Vit C-Mn (GLUCOSAMINE CHONDR 1500 COMPLX PO) Take by mouth daily.    [provider]  Multiple Vitamin (MULTIVITAMIN) tablet Take 1 tablet by mouth daily.    [provider]  norethindrone-ethinyl estradiol (LOESTRIN) 1-20 MG-MCG tablet Take 1 tablet by mouth daily.    [provider]  oxyCODONE-acetaminophen (PERCOCET) 7.5-325 MG tablet Take 1 tablet by mouth every 4  (four) hours as needed for severe pain. 08/17/19   Troxler, Molli Hazard, DPM  RIVAROXABAN Carlena Hurl) VTE STARTER PACK (15 & 20 MG TABLETS) Follow package directions: Take one 15mg  tablet by mouth twice a day. On day 22, switch to one 20mg  tablet once a day. Take with food. 08/30/19   , MD     Allergies Patient has no known allergies.  Family History  Problem Relation Age of Onset  . Breast cancer Neg Hx     Social History Social History   Tobacco Use  . Smoking status: Never Smoker  . Smokeless tobacco: Never Used  Substance Use Topics  . Alcohol use: Yes    Alcohol/week: 0.0 standard drinks    Comment: rarely  . Drug use: No    Review of Systems  Constitutional: No fever/chills  Cardiovascular: Denies chest pain. Respiratory: Denies shortness of breath.   Musculoskeletal: As above Skin: Negative for rash. Neurological: Negative for numbness or tingling   ____________________________________________   PHYSICAL EXAM:  VITAL SIGNS: ED Triage Vitals  Enc Vitals Group     BP 08/30/19 1656 114/73     Pulse Rate 08/30/19 1656 (!) 102     Resp 08/30/19 1656 18     Temp 08/30/19 1656 99.6 F (37.6 C)     Temp Source 08/30/19 1656 Oral     SpO2 08/30/19 1656 99 %     Weight 08/30/19 1715 67.1 kg (148 lb)  Height 08/30/19 1715 1.702 m (5\' 7" )     Head Circumference --      Peak Flow --      Pain Score 08/30/19 1714 0     Pain Loc --      Pain Edu? --      Excl. in GC? --     Constitutional: Alert and oriented. No acute distress. Pleasant and interactive   Cardiovascular: Normal rate, regular rhythm. Good peripheral circulation. Respiratory: Normal respiratory effort.  No retractions.    Musculoskeletal: Left foot, Ace wrapped, extremity is warm and well perfused without significant swelling Neurologic:  Normal speech and language. No gross focal neurologic deficits are appreciated.  Skin:  Skin is warm, dry and intact. Psychiatric: Mood and  affect are normal. Speech and behavior are normal.  ____________________________________________   LABS (all labs ordered are listed, but only abnormal results are displayed)  Labs Reviewed  CBC - Abnormal; Notable for the following components:      Result Value   WBC 12.1 (*)    All other components within normal limits  BASIC METABOLIC PANEL - Abnormal; Notable for the following components:   Glucose, Bld 113 (*)    All other components within normal limits  PROTIME-INR  APTT  HEPATIC FUNCTION PANEL   ____________________________________________  EKG  None ____________________________________________  RADIOLOGY  Reviewed ultrasound performed today, occlusive popliteal clot ____________________________________________   PROCEDURES  Procedure(s) performed: No  Procedures   Critical Care performed: No ____________________________________________   INITIAL IMPRESSION / ASSESSMENT AND PLAN / ED COURSE  Pertinent labs & imaging results that were available during my care of the patient were reviewed by me and considered in my medical decision making (see chart for details).  Patient presents with recent surgery, also on hormones with left lower extremity popliteal DVT which is occlusive.  Lab work today is reassuring.  Discussed with Dr. 10/31/19 who notes he can see the patient in his office tomorrow, will start Xarelto now.  Discussed risks and benefits of blood thinning medication.    ____________________________________________   FINAL CLINICAL IMPRESSION(S) / ED DIAGNOSES  Final diagnoses:  Acute deep vein thrombosis (DVT) of popliteal vein of left lower extremity (HCC)        Note:  This document was prepared using Dragon voice recognition software and may include unintentional dictation errors.   Gilda Crease, MD 08/30/19 2006

## 2019-08-31 ENCOUNTER — Ambulatory Visit (INDEPENDENT_AMBULATORY_CARE_PROVIDER_SITE_OTHER): Payer: BC Managed Care – PPO | Admitting: Vascular Surgery

## 2019-08-31 ENCOUNTER — Encounter (INDEPENDENT_AMBULATORY_CARE_PROVIDER_SITE_OTHER): Payer: Self-pay

## 2019-08-31 ENCOUNTER — Encounter (INDEPENDENT_AMBULATORY_CARE_PROVIDER_SITE_OTHER): Payer: Self-pay | Admitting: Vascular Surgery

## 2019-08-31 DIAGNOSIS — I82432 Acute embolism and thrombosis of left popliteal vein: Secondary | ICD-10-CM | POA: Diagnosis not present

## 2019-09-01 ENCOUNTER — Other Ambulatory Visit (INDEPENDENT_AMBULATORY_CARE_PROVIDER_SITE_OTHER): Payer: Self-pay | Admitting: Nurse Practitioner

## 2019-09-02 ENCOUNTER — Encounter (INDEPENDENT_AMBULATORY_CARE_PROVIDER_SITE_OTHER): Payer: Self-pay | Admitting: Vascular Surgery

## 2019-09-02 DIAGNOSIS — I82409 Acute embolism and thrombosis of unspecified deep veins of unspecified lower extremity: Secondary | ICD-10-CM | POA: Insufficient documentation

## 2019-09-02 NOTE — Progress Notes (Signed)
MRN : [id number]  Mia Church is a 49 y.o. (01-Feb-1971) female who presents with chief complaint of  Chief Complaint  Patient presents with  . Follow-up    Acute DVT  .  History of Present Illness:   The patient presents to the office for evaluation of DVT.  She is s/p Osteotomy fifth metatarsal left foot with 2 screw fixation on 08/17/2019 and has been in a hard splint since then.  5 days prior to this office visit she experienced pain and swelling of the left calf.  For this reason and ultrasound was ordered.  DVT was identified at [facility] by Duplex ultrasound performed on 08/30/2019.  The initial symptoms were pain and swelling in the lower extremity.  She has been started on Xarelto.  Nonsmoker, on OCP, no family history of hypercoagulable disorder.  The patient notes the leg continues to be very painful with dependency and swells quite a bite.  Symptoms are much better with elevation.  The patient notes minimal edema in the morning which steadily worsens throughout the day.    The patient has not been using compression therapy at this point.  No SOB or pleuritic chest pains.  No cough or hemoptysis.  No blood per rectum or blood in any sputum.  No excessive bruising per the patient.   Current Meds  Medication Sig  . cholecalciferol (VITAMIN D3) 25 MCG (1000 UNIT) tablet Take 1,000 Units by mouth daily.  . Glucosamine-Chondroit-Vit C-Mn (GLUCOSAMINE CHONDR 1500 COMPLX PO) Take by mouth daily.  . Multiple Vitamin (MULTIVITAMIN) tablet Take 1 tablet by mouth daily.  . Omega-3 Fatty Acids (FISH OIL) 1000 MG CAPS Take by mouth.  Marland Kitchen RIVAROXABAN (XARELTO) VTE STARTER PACK (15 & 20 MG TABLETS) Follow package directions: Take one 15mg  tablet by mouth twice a day. On day 22, switch to one 20mg  tablet once a day. Take with food.    Past Medical History:  Diagnosis Date  . Arthritis    osteoarthritis right hand  . Complication of anesthesia    N & V with gallbladder surgery  .  Wears contact lenses     Past Surgical History:  Procedure Laterality Date  . CHOLECYSTECTOMY     2012  . GALLBLADDER SURGERY    . SEPTOPLASTY  01/29/2011  . VEIN REPAIR  2002   legs   . WEIL OSTEOTOMY Left 08/17/2019   Procedure: WEIL OSTEOTOMY 5TH LEFT;  Surgeon: 14/07/2010, DPM;  Location: [facility];  Service: Podiatry;  Laterality: Left;  LMA LOCAL    Social History Social History   Tobacco Use  . Smoking status: Never Smoker  . Smokeless tobacco: Never Used  Substance Use Topics  . Alcohol use: Yes    Alcohol/week: 0.0 standard drinks    Comment: rarely  . Drug use: No    Family History Family History  Problem Relation Age of Onset  . Breast cancer Neg Hx   No family history of bleeding/clotting disorders, porphyria or autoimmune disease   No Known Allergies   REVIEW OF SYSTEMS (Negative unless checked)  Constitutional: [] Weight loss  [] Fever  [] Chills Cardiac: [] Chest pain   [] Chest pressure   [] Palpitations   [] Shortness of breath when laying flat   [] Shortness of breath with exertion. Vascular:  [] Pain in legs with walking   [x] Pain in legs at rest  [x] History of DVT   [] Phlebitis   [] Swelling in legs   [] Varicose veins   [] Non-healing ulcers Pulmonary:   []   Uses home oxygen   [] Productive cough   [] Hemoptysis   [] Wheeze  [] COPD   [] Asthma Neurologic:  [] Dizziness   [] Seizures   [] History of stroke   [] History of TIA  [] Aphasia   [] Vissual changes   [] Weakness or numbness in arm   [] Weakness or numbness in leg Musculoskeletal:   [] Joint swelling   [x] Joint pain   [] Low back pain Hematologic:  [] Easy bruising  [] Easy bleeding   [] Hypercoagulable state   [] Anemic Gastrointestinal:  [] Diarrhea   [] Vomiting  [] Gastroesophageal reflux/heartburn   [] Difficulty swallowing. Genitourinary:  [] Chronic kidney disease   [] Difficult urination  [] Frequent urination   [] Blood in urine Skin:  [] Rashes   [] Ulcers  Psychological:  [] History of anxiety   []   History of major depression.  Physical Examination  Vitals:   08/31/19 1106  BP: 118/74  Pulse: 83  Weight: 145 lb (65.8 kg)  Height: 5\' 7"  (1.702 m)   Body mass index is 22.71 kg/m. Gen: WD/WN, NAD Head: [place]/AT, No temporalis wasting.  Ear/Nose/Throat: Hearing grossly intact, nares w/o erythema or drainage, poor dentition Eyes: PER, EOMI, sclera nonicteric.  Neck: Supple, no masses.  No bruit or JVD.  Pulmonary:  Good air movement, clear to auscultation bilaterally, no use of accessory muscles.  Cardiac: RRR, normal S1, S2, no Murmurs. Vascular: scattered varicosities present bilaterally.  no venous stasis changes to the legs bilaterally.  2+ soft pitting edema left  Vessel Right Left  Radial Palpable Palpable  Gastrointestinal: soft, non-distended. No guarding/no peritoneal signs.  Musculoskeletal: M/S 5/5 throughout.  No deformity or atrophy.  Neurologic: CN 2-12 intact. Pain and light touch intact in extremities.  Symmetrical.  Speech is fluent. Motor exam as listed above. Psychiatric: Judgment intact, Mood & affect appropriate for pt's clinical situation. Dermatologic: No rashes or ulcers noted.  No changes consistent with cellulitis.   CBC Lab Results  Component Value Date   WBC 12.1 (H) 08/30/2019   HGB 13.0 08/30/2019   HCT 36.7 08/30/2019   MCV 90.2 08/30/2019   PLT 375 08/30/2019    BMET    Component Value Date/Time   NA 139 08/30/2019 1723   K 3.6 08/30/2019 1723   CL 105 08/30/2019 1723   CO2 24 08/30/2019 1723   GLUCOSE 113 (H) 08/30/2019 1723   BUN 11 08/30/2019 1723   CREATININE 0.64 08/30/2019 1723   CALCIUM 9.1 08/30/2019 1723   GFRNONAA >60 08/30/2019 1723   GFRAA >60 08/30/2019 1723   Estimated Creatinine Clearance: 83.6 mL/min (by C-G formula based on SCr of 0.64 mg/dL).  COAG Lab Results  Component Value Date   INR 1.0 08/30/2019    Radiology Venous Img Lower Unilateral Left (DVT)  Result Date: 08/30/2019 CLINICAL DATA:  Left  lower extremity pain and swelling EXAM: LEFT LOWER EXTREMITY VENOUS DOPPLER ULTRASOUND TECHNIQUE: Gray-scale sonography with graded compression, as well as color Doppler and duplex ultrasound were performed to evaluate the lower extremity deep venous systems from the level of the common femoral vein and including the common femoral, femoral, profunda femoral, popliteal and calf veins including the posterior tibial, peroneal and gastrocnemius veins when visible. The superficial great saphenous vein was also interrogated. Spectral Doppler was utilized to evaluate flow at rest and with distal augmentation maneuvers in the common femoral, femoral and popliteal veins. COMPARISON:  None. FINDINGS: Contralateral Common Femoral Vein: Respiratory phasicity is normal and symmetric with the symptomatic side. No evidence of thrombus. Normal compressibility. Common Femoral Vein: No evidence of thrombus. Normal  compressibility, respiratory phasicity and response to augmentation. Saphenofemoral Junction: No evidence of thrombus. Normal compressibility and flow on color Doppler imaging. Profunda Femoral Vein: No evidence of thrombus. Normal compressibility and flow on color Doppler imaging. Femoral Vein: No evidence of thrombus. Normal compressibility, respiratory phasicity and response to augmentation. Popliteal Vein: Hypoechoic intraluminal thrombus. Vessel noncompressible. Thrombus appears occlusive. No phasic flow. Calf Veins: Popliteal thrombus appears to extend into the tibial and peroneal veins of the calf. IMPRESSION: Positive exam for occlusive left popliteal DVT extending into the calf veins. Electronically Signed   By: Judie Petit.  Shick M.D.   On: 08/30/2019 15:54     Assessment/Plan 1. Acute deep vein thrombosis (DVT) of popliteal vein of left lower extremity (HCC) Recommend:   No surgery or intervention at this point in time.  IVC filter is not indicated at present.  Patient's duplex ultrasound of the venous system  shows DVT from the popliteal to the femoral veins.  The patient is initiated on anticoagulation   Elevation was stressed, use of a recliner was discussed.  I have had a long discussion with the patient regarding DVT and post phlebitic changes such as swelling and why it  causes symptoms such as pain.  The patient will wear graduated compression stockings beginning after the splint is off, on a daily basis a prescription was given. The patient will  beginning wearing the stockings first thing in the morning and removing them in the evening. The patient is instructed specifically not to sleep in the stockings.  In addition, behavioral modification including elevation during the day and avoidance of prolonged dependency will be initiated.    The patient will continue anticoagulation for now as there have not been any problems or complications at this point.   - VAS [place] LOWER EXTREMITY VENOUS (DVT); Future   Levora Dredge, MD  09/02/2019 1:51 PM

## 2019-09-07 ENCOUNTER — Telehealth (INDEPENDENT_AMBULATORY_CARE_PROVIDER_SITE_OTHER): Payer: Self-pay

## 2019-09-07 ENCOUNTER — Other Ambulatory Visit: Payer: Self-pay | Admitting: Family Medicine

## 2019-09-07 DIAGNOSIS — M19041 Primary osteoarthritis, right hand: Secondary | ICD-10-CM | POA: Insufficient documentation

## 2019-09-07 NOTE — Telephone Encounter (Signed)
Patient left a message requesting for appointment to speak with Dr Gilda Crease about some information that she discuss with Dr Orland Jarred. I spoke with Dr Gilda Crease and is fine with the patient coming in within two weeks.

## 2019-09-11 ENCOUNTER — Ambulatory Visit (INDEPENDENT_AMBULATORY_CARE_PROVIDER_SITE_OTHER): Payer: Self-pay | Admitting: Vascular Surgery

## 2019-09-13 NOTE — Progress Notes (Signed)
MRN : [id number]  Mia Church is a 49 y.o. (May 30, 1970) female who presents with chief complaint of No chief complaint on file. Marland Kitchen  History of Present Illness:   The patient presents to the office for evaluation of DVT.  She is s/p Osteotomy fifth metatarsal left foot with 2 screw fixation on 08/17/2019 and has been in a hard splint since then.  5 days prior to this office visit she experienced pain and swelling of the left calf.  For this reason and ultrasound was ordered.  DVT was identified at [facility] by Duplex ultrasound performed on 08/30/2019.  The initial symptoms were pain and swelling in the lower extremity.  She has been started on Xarelto.  Nonsmoker, on OCP, no family history of hypercoagulable disorder.  The patient notes the leg continues to be very painful with dependency and swells quite a bite.  Symptoms are much better with elevation.  The patient notes minimal edema in the morning which steadily worsens throughout the day.    The patient has not been using compression therapy at this point.  No SOB or pleuritic chest pains.  No cough or hemoptysis.  No blood per rectum or blood in any sputum.  No excessive bruising per the patient.  Duplex ultrasound of the left venous system shows improvement of the popliteal DVT, there is resolution of the tibial vein clot and now partial in the popliteal vein  No outpatient medications have been marked as taking for the 09/14/19 encounter (Appointment) with Gilda Crease, Latina Craver, MD.    Past Medical History:  Diagnosis Date  . Arthritis    osteoarthritis right hand  . Complication of anesthesia    N & V with gallbladder surgery  . Wears contact lenses     Past Surgical History:  Procedure Laterality Date  . CHOLECYSTECTOMY     2012  . GALLBLADDER SURGERY    . SEPTOPLASTY  01/29/2011  . VEIN REPAIR  2002   legs   . WEIL OSTEOTOMY Left 08/17/2019   Procedure: WEIL OSTEOTOMY 5TH LEFT;  Surgeon: Recardo Evangelist, DPM;   Location: [facility];  Service: Podiatry;  Laterality: Left;  LMA LOCAL    Social History Social History   Tobacco Use  . Smoking status: Never Smoker  . Smokeless tobacco: Never Used  Substance Use Topics  . Alcohol use: Yes    Alcohol/week: 0.0 standard drinks    Comment: rarely  . Drug use: No    Family History Family History  Problem Relation Age of Onset  . Breast cancer Neg Hx     No Known Allergies   REVIEW OF SYSTEMS (Negative unless checked)  Constitutional: [] Weight loss  [] Fever  [] Chills Cardiac: [] Chest pain   [] Chest pressure   [] Palpitations   [] Shortness of breath when laying flat   [] Shortness of breath with exertion. Vascular:  [] Pain in legs with walking   [] Pain in legs at rest  [x] History of DVT   [] Phlebitis   [x] Swelling in legs   [] Varicose veins   [] Non-healing ulcers Pulmonary:   [] Uses home oxygen   [] Productive cough   [] Hemoptysis   [] Wheeze  [] COPD   [] Asthma Neurologic:  [] Dizziness   [] Seizures   [] History of stroke   [] History of TIA  [] Aphasia   [] Vissual changes   [] Weakness or numbness in arm   [] Weakness or numbness in leg Musculoskeletal:   [] Joint swelling   [] Joint pain   [] Low back pain Hematologic:  [] Easy bruising  []   Easy bleeding   [] Hypercoagulable state   [] Anemic Gastrointestinal:  [] Diarrhea   [] Vomiting  [] Gastroesophageal reflux/heartburn   [] Difficulty swallowing. Genitourinary:  [] Chronic kidney disease   [] Difficult urination  [] Frequent urination   [] Blood in urine Skin:  [] Rashes   [] Ulcers  Psychological:  [] History of anxiety   []  History of major depression.  Physical Examination  There were no vitals filed for this visit. There is no height or weight on file to calculate BMI. Gen: WD/WN, NAD Head: [place]/AT, No temporalis wasting.  Ear/Nose/Throat: Hearing grossly intact, nares w/o erythema or drainage Eyes: PER, EOMI, sclera nonicteric.  Neck: Supple, no large masses.   Pulmonary:  Good air movement, no  audible wheezing bilaterally, no use of accessory muscles.  Cardiac: RRR, no JVD Vascular:scattered varicosities present bilaterally.  Mild venous stasis changes to the legs bilaterally.  1+ soft pitting edema Vessel Right Left  Radial Palpable Palpable  PT Palpable Palpable  DP Palpable Palpable  Gastrointestinal: Non-distended. No guarding/no peritoneal signs.  Musculoskeletal: M/S 5/5 throughout.  No deformity or atrophy.  Neurologic: CN 2-12 intact. Symmetrical.  Speech is fluent. Motor exam as listed above. Psychiatric: Judgment intact, Mood & affect appropriate for pt's clinical situation. Dermatologic: No rashes or ulcers noted.  No changes consistent with cellulitis. Lymph : No lichenification or skin changes of chronic lymphedema.  CBC Lab Results  Component Value Date   WBC 12.1 (H) 08/30/2019   HGB 13.0 08/30/2019   HCT 36.7 08/30/2019   MCV 90.2 08/30/2019   PLT 375 08/30/2019    BMET    Component Value Date/Time   NA 139 08/30/2019 1723   K 3.6 08/30/2019 1723   CL 105 08/30/2019 1723   CO2 24 08/30/2019 1723   GLUCOSE 113 (H) 08/30/2019 1723   BUN 11 08/30/2019 1723   CREATININE 0.64 08/30/2019 1723   CALCIUM 9.1 08/30/2019 1723   GFRNONAA >60 08/30/2019 1723   GFRAA >60 08/30/2019 1723   Estimated Creatinine Clearance: 83.6 mL/min (by C-G formula based on SCr of 0.64 mg/dL).  COAG Lab Results  Component Value Date   INR 1.0 08/30/2019    Radiology 10/31/2019 Venous Img Lower Unilateral Left (DVT)  Result Date: 08/30/2019 CLINICAL DATA:  Left lower extremity pain and swelling EXAM: LEFT LOWER EXTREMITY VENOUS DOPPLER ULTRASOUND TECHNIQUE: Gray-scale sonography with graded compression, as well as color Doppler and duplex ultrasound were performed to evaluate the lower extremity deep venous systems from the level of the common femoral vein and including the common femoral, femoral, profunda femoral, popliteal and calf veins including the posterior tibial, peroneal  and gastrocnemius veins when visible. The superficial great saphenous vein was also interrogated. Spectral Doppler was utilized to evaluate flow at rest and with distal augmentation maneuvers in the common femoral, femoral and popliteal veins. COMPARISON:  None. FINDINGS: Contralateral Common Femoral Vein: Respiratory phasicity is normal and symmetric with the symptomatic side. No evidence of thrombus. Normal compressibility. Common Femoral Vein: No evidence of thrombus. Normal compressibility, respiratory phasicity and response to augmentation. Saphenofemoral Junction: No evidence of thrombus. Normal compressibility and flow on color Doppler imaging. Profunda Femoral Vein: No evidence of thrombus. Normal compressibility and flow on color Doppler imaging. Femoral Vein: No evidence of thrombus. Normal compressibility, respiratory phasicity and response to augmentation. Popliteal Vein: Hypoechoic intraluminal thrombus. Vessel noncompressible. Thrombus appears occlusive. No phasic flow. Calf Veins: Popliteal thrombus appears to extend into the tibial and peroneal veins of the calf. IMPRESSION: Positive exam for occlusive left popliteal DVT extending  into the calf veins. Electronically Signed   By: Judie Petit.  Shick M.D.   On: 08/30/2019 15:54     Assessment/Plan 1. Acute deep vein thrombosis (DVT) of popliteal vein of left lower extremity (HCC) Recommend:   No surgery or intervention at this point in time.  IVC filter is not indicated at present.  Patient's duplex ultrasound of the venous system shows DVT in the popliteal with partial compressibility.  The patient is initiated on anticoagulation   Elevation was stressed, use of a recliner was discussed.  I have had a long discussion with the patient regarding DVT and post phlebitic changes such as swelling and why it  causes symptoms such as pain.  The patient will wear graduated compression stockings class 1 (20-30 mmHg), beginning after three full days of  anticoagulation, on a daily basis a prescription was given. The patient will  beginning wearing the stockings first thing in the morning and removing them in the evening. The patient is instructed specifically not to sleep in the stockings.  In addition, behavioral modification including elevation during the day and avoidance of prolonged dependency will be initiated.    The patient will continue anticoagulation for now as there have not been any problems or complications at this point.   She will follow up in November with another DVT scan and if improved then a hypercoagulable panel will be drawn    Levora Dredge, MD  09/13/2019 3:46 PM

## 2019-09-14 ENCOUNTER — Encounter (INDEPENDENT_AMBULATORY_CARE_PROVIDER_SITE_OTHER): Payer: Self-pay | Admitting: Vascular Surgery

## 2019-09-14 ENCOUNTER — Ambulatory Visit (INDEPENDENT_AMBULATORY_CARE_PROVIDER_SITE_OTHER): Payer: BC Managed Care – PPO

## 2019-09-14 ENCOUNTER — Ambulatory Visit (INDEPENDENT_AMBULATORY_CARE_PROVIDER_SITE_OTHER): Payer: BC Managed Care – PPO | Admitting: Vascular Surgery

## 2019-09-14 ENCOUNTER — Other Ambulatory Visit: Payer: Self-pay

## 2019-09-14 VITALS — BP 120/75 | HR 89 | Resp 16 | Wt 141.4 lb

## 2019-09-14 DIAGNOSIS — I82432 Acute embolism and thrombosis of left popliteal vein: Secondary | ICD-10-CM

## 2019-10-03 ENCOUNTER — Other Ambulatory Visit: Admission: RE | Admit: 2019-10-03 | Payer: BC Managed Care – PPO | Source: Ambulatory Visit

## 2019-10-05 ENCOUNTER — Ambulatory Visit: Admit: 2019-10-05 | Payer: BC Managed Care – PPO | Admitting: Podiatry

## 2019-10-05 SURGERY — BUNIONECTOMY, AKIN
Anesthesia: Choice | Site: Toe | Laterality: Right

## 2019-11-20 ENCOUNTER — Telehealth (INDEPENDENT_AMBULATORY_CARE_PROVIDER_SITE_OTHER): Payer: Self-pay | Admitting: Vascular Surgery

## 2019-11-21 NOTE — Telephone Encounter (Signed)
Called stating that Dr. Orland Jarred & GS had a consult and discussed patient getting labs done (13 vials of blood) to see if she had a blood clot disorder. Patient states that they discussed labs to be done in Dec 2021. I spoke to GS yesterday and he couldn't remember any labs that was discussed. Patient was last seen 09-14-19 (LE Ven DVT studies.)

## 2019-11-22 ENCOUNTER — Other Ambulatory Visit (INDEPENDENT_AMBULATORY_CARE_PROVIDER_SITE_OTHER): Payer: Self-pay | Admitting: Vascular Surgery

## 2019-11-28 ENCOUNTER — Telehealth (INDEPENDENT_AMBULATORY_CARE_PROVIDER_SITE_OTHER): Payer: Self-pay

## 2019-11-28 NOTE — Telephone Encounter (Signed)
The pt left a message on the nurses line wanting to know where her labs will be drawn and the CPT code for the lab. I made the pt aware per her last note that  She has a F/U in November  And that she will find out where  Her labs are to be drawn then as well as the CPT code if needed for insurance .

## 2020-01-01 ENCOUNTER — Ambulatory Visit (INDEPENDENT_AMBULATORY_CARE_PROVIDER_SITE_OTHER): Payer: BC Managed Care – PPO | Admitting: Vascular Surgery

## 2020-01-01 ENCOUNTER — Encounter (INDEPENDENT_AMBULATORY_CARE_PROVIDER_SITE_OTHER): Payer: BC Managed Care – PPO

## 2020-01-02 ENCOUNTER — Other Ambulatory Visit (INDEPENDENT_AMBULATORY_CARE_PROVIDER_SITE_OTHER): Payer: Self-pay | Admitting: Vascular Surgery

## 2020-01-02 DIAGNOSIS — I82432 Acute embolism and thrombosis of left popliteal vein: Secondary | ICD-10-CM

## 2020-01-04 ENCOUNTER — Encounter (INDEPENDENT_AMBULATORY_CARE_PROVIDER_SITE_OTHER): Payer: Self-pay

## 2020-01-04 ENCOUNTER — Ambulatory Visit (INDEPENDENT_AMBULATORY_CARE_PROVIDER_SITE_OTHER): Payer: BC Managed Care – PPO | Admitting: Vascular Surgery

## 2020-01-04 ENCOUNTER — Ambulatory Visit (INDEPENDENT_AMBULATORY_CARE_PROVIDER_SITE_OTHER): Payer: BC Managed Care – PPO

## 2020-01-04 ENCOUNTER — Other Ambulatory Visit: Payer: Self-pay

## 2020-01-04 DIAGNOSIS — D6859 Other primary thrombophilia: Secondary | ICD-10-CM

## 2020-01-04 DIAGNOSIS — I82432 Acute embolism and thrombosis of left popliteal vein: Secondary | ICD-10-CM | POA: Diagnosis not present

## 2020-01-04 NOTE — Progress Notes (Signed)
MRN : [id number]  Mia Church is a 49 y.o. (1970-11-02) female who presents with chief complaint of No chief complaint on file. Marland Kitchen  History of Present Illness:   The patient presents to the office for evaluation of DVT.She is s/pOsteotomy fifth metatarsal left foot with 2 screw fixationon 08/17/2019 and has been in a hard splint since then. 5 days prior to this office visit she experienced pain and swelling of the left calf. For this reason and ultrasound was ordered. DVT was identified at [facility] by Duplex ultrasoundperformed on 08/30/2019. The initial symptoms were pain and swelling in the lower extremity.  She has been on Xarelto without difficulty.  Nonsmoker, on OCP, no family history of hypercoagulable disorder.  The patient notes the leg continues to be very painful with dependency and swells quite a bite. Symptoms are much better with elevation. The patient notes minimal edema in the morning which steadily worsens throughout the day.   The patient has not been using compression therapy at this point.  No SOB or pleuritic chest pains. No cough or hemoptysis.  No blood per rectum or blood in any sputum. No excessive bruising per the patient.  Duplex ultrasound from 09/14/2019,  of the left venous system shows improvement of the popliteal DVT, there is resolution of the tibial vein clot and now partial in the popliteal vein  DVT ultrasound of the left lower extremity performed today demonstrates total resolution of her thrombus no evidence of chronic DVT in the tibial or popliteal veins  No outpatient medications have been marked as taking for the 01/04/20 encounter (Appointment) with Gilda Crease, Latina Craver, MD.    Past Medical History:  Diagnosis Date   Arthritis    osteoarthritis right hand   Complication of anesthesia    N & V with gallbladder surgery   Wears contact lenses     Past Surgical History:  Procedure Laterality Date   CHOLECYSTECTOMY       2012   GALLBLADDER SURGERY     SEPTOPLASTY  01/29/2011   VEIN REPAIR  2002   legs    WEIL OSTEOTOMY Left 08/17/2019   Procedure: WEIL OSTEOTOMY 5TH LEFT;  Surgeon: Recardo Evangelist, DPM;  Location: [facility];  Service: Podiatry;  Laterality: Left;  LMA LOCAL    Social History Social History   Tobacco Use   Smoking status: Never Smoker   Smokeless tobacco: Never Used  Substance Use Topics   Alcohol use: Yes    Alcohol/week: 0.0 standard drinks    Comment: rarely   Drug use: No    Family History Family History  Problem Relation Age of Onset   Breast cancer Neg Hx     No Known Allergies   REVIEW OF SYSTEMS (Negative unless checked)  Constitutional: [] Weight loss  [] Fever  [] Chills Cardiac: [] Chest pain   [] Chest pressure   [] Palpitations   [] Shortness of breath when laying flat   [] Shortness of breath with exertion. Vascular:  [] Pain in legs with walking   [x] Pain in legs at rest  [x] History of DVT   [] Phlebitis   [x] Swelling in legs   [] Varicose veins   [] Non-healing ulcers Pulmonary:   [] Uses home oxygen   [] Productive cough   [] Hemoptysis   [] Wheeze  [] COPD   [] Asthma Neurologic:  [] Dizziness   [] Seizures   [] History of stroke   [] History of TIA  [] Aphasia   [] Vissual changes   [] Weakness or numbness in arm   [] Weakness or numbness in leg  Musculoskeletal:   [] Joint swelling   [] Joint pain   [] Low back pain Hematologic:  [] Easy bruising  [] Easy bleeding   [] Hypercoagulable state   [] Anemic Gastrointestinal:  [] Diarrhea   [] Vomiting  [] Gastroesophageal reflux/heartburn   [] Difficulty swallowing. Genitourinary:  [] Chronic kidney disease   [] Difficult urination  [] Frequent urination   [] Blood in urine Skin:  [] Rashes   [] Ulcers  Psychological:  [] History of anxiety   []  History of major depression.  Physical Examination  There were no vitals filed for this visit. There is no height or weight on file to calculate BMI. Gen: WD/WN, NAD Head: [place]/AT, No  temporalis wasting.  Ear/Nose/Throat: Hearing grossly intact, nares w/o erythema or drainage Eyes: PER, EOMI, sclera nonicteric.  Neck: Supple, no large masses.   Pulmonary:  Good air movement, no audible wheezing bilaterally, no use of accessory muscles.  Cardiac: RRR, no JVD Vascular: Legs are symmetric with trace edema Vessel Right Left  Radial Palpable Palpable  Gastrointestinal: Non-distended. No guarding/no peritoneal signs.  Musculoskeletal: M/S 5/5 throughout.  No deformity or atrophy.  Neurologic: CN 2-12 intact. Symmetrical.  Speech is fluent. Motor exam as listed above. Psychiatric: Judgment intact, Mood & affect appropriate for pt's clinical situation. Dermatologic: No rashes or ulcers noted.  No changes consistent with cellulitis.   CBC Lab Results  Component Value Date   WBC 12.1 (H) 08/30/2019   HGB 13.0 08/30/2019   HCT 36.7 08/30/2019   MCV 90.2 08/30/2019   PLT 375 08/30/2019    BMET    Component Value Date/Time   NA 139 08/30/2019 1723   K 3.6 08/30/2019 1723   CL 105 08/30/2019 1723   CO2 24 08/30/2019 1723   GLUCOSE 113 (H) 08/30/2019 1723   BUN 11 08/30/2019 1723   CREATININE 0.64 08/30/2019 1723   CALCIUM 9.1 08/30/2019 1723   GFRNONAA >60 08/30/2019 1723   GFRAA >60 08/30/2019 1723   CrCl cannot be calculated (Patient's most recent lab result is older than the maximum 21 days allowed.).  COAG Lab Results  Component Value Date   INR 1.0 08/30/2019    Radiology No results found.   Assessment/Plan 1. Acute deep vein thrombosis (DVT) of popliteal vein of left lower extremity (HCC) We reviewed our detailed discussion regarding her DVT the possible causes and the potential association with a hypercoagulable state.  At this point she is almost done with 4 months of anticoagulation.  Her ultrasound is steadily improved and on today's study there is no evidence of residual thrombus.  She will continue her Xarelto until this bottle is completed.  I  will see her back in mid to late December she will obtain a hypercoagulable panel in early December and we will review this at the time she returns to see me. - Hypercoagulable panel, comprehensive; Future  2. Primary hypercoagulable state (HCC) See #1 - Hypercoagulable panel, comprehensive; Future   , MD  01/04/2020 1:10 PM

## 2020-01-05 ENCOUNTER — Encounter (INDEPENDENT_AMBULATORY_CARE_PROVIDER_SITE_OTHER): Payer: Self-pay | Admitting: Vascular Surgery

## 2020-01-11 ENCOUNTER — Telehealth (INDEPENDENT_AMBULATORY_CARE_PROVIDER_SITE_OTHER): Payer: Self-pay

## 2020-01-11 NOTE — Telephone Encounter (Signed)
Patient called asking would fine that she stop taking the Xarelto so she can start taking ibuprofen to help with her lower back. I spoke with Dr Gilda Crease and is fine with patient stopping the Xarelto. Patient was made aware with medical recommendations

## 2020-01-29 ENCOUNTER — Other Ambulatory Visit
Admission: RE | Admit: 2020-01-29 | Discharge: 2020-01-29 | Disposition: A | Discharge status: Home / Self Care | Payer: BC Managed Care – PPO | Attending: Vascular Surgery | Admitting: Vascular Surgery

## 2020-01-29 DIAGNOSIS — I82432 Acute embolism and thrombosis of left popliteal vein: Secondary | ICD-10-CM | POA: Insufficient documentation

## 2020-01-29 DIAGNOSIS — D6859 Other primary thrombophilia: Secondary | ICD-10-CM | POA: Diagnosis not present

## 2020-02-08 ENCOUNTER — Ambulatory Visit (INDEPENDENT_AMBULATORY_CARE_PROVIDER_SITE_OTHER): Payer: BC Managed Care – PPO | Admitting: Vascular Surgery

## 2020-02-08 ENCOUNTER — Other Ambulatory Visit: Payer: Self-pay

## 2020-02-08 ENCOUNTER — Encounter (INDEPENDENT_AMBULATORY_CARE_PROVIDER_SITE_OTHER): Payer: Self-pay | Admitting: Vascular Surgery

## 2020-02-08 VITALS — BP 132/80 | HR 80 | Resp 16 | Wt 129.4 lb

## 2020-02-08 DIAGNOSIS — I82432 Acute embolism and thrombosis of left popliteal vein: Secondary | ICD-10-CM

## 2020-02-08 NOTE — Progress Notes (Signed)
MRN : [id number]  Mia Church is a 49 y.o. (1970-08-14) female who presents with chief complaint of  Chief Complaint  Patient presents with  . Follow-up    1 month follow up  .  History of Present Illness:   Patient is doing well  Current Meds  Medication Sig  . cholecalciferol (VITAMIN D3) 25 MCG (1000 UNIT) tablet Take 1,000 Units by mouth daily.  . Glucosamine-Chondroit-Vit C-Mn (GLUCOSAMINE CHONDR 1500 COMPLX PO) Take by mouth daily.  . Multiple Vitamin (MULTIVITAMIN) tablet Take 1 tablet by mouth daily.  . rivaroxaban (XARELTO) 20 MG TABS tablet Take 20 mg by mouth daily with supper. Taking medication for 7 days per provider then patient can stop    Past Medical History:  Diagnosis Date  . Arthritis    osteoarthritis right hand  . Complication of anesthesia    N & V with gallbladder surgery  . Wears contact lenses     Past Surgical History:  Procedure Laterality Date  . CHOLECYSTECTOMY     2012  . GALLBLADDER SURGERY    . SEPTOPLASTY  01/29/2011  . VEIN REPAIR  2002   legs   . WEIL OSTEOTOMY Left 08/17/2019   Procedure: WEIL OSTEOTOMY 5TH LEFT;  Surgeon: Recardo Evangelist, DPM;  Location: [facility];  Service: Podiatry;  Laterality: Left;  [facility] LOCAL    Social History Social History   Tobacco Use  . Smoking status: Never Smoker  . Smokeless tobacco: Never Used  Substance Use Topics  . Alcohol use: Yes    Alcohol/week: 0.0 standard drinks    Comment: rarely  . Drug use: No    Family History Family History  Problem Relation Age of Onset  . Breast cancer Neg Hx     No Known Allergies   REVIEW OF SYSTEMS (Negative unless checked)  Constitutional: [] Weight loss  [] Fever  [] Chills Cardiac: [] Chest pain   [] Chest pressure   [] Palpitations   [] Shortness of breath when laying flat   [] Shortness of breath with exertion. Vascular:  [] Pain in legs with walking   [] Pain in legs at rest  [] History of DVT   [] Phlebitis   [] Swelling in legs    [] Varicose veins   [] Non-healing ulcers Pulmonary:   [] Uses home oxygen   [] Productive cough   [] Hemoptysis   [] Wheeze  [] COPD   [] Asthma Neurologic:  [] Dizziness   [] Seizures   [] History of stroke   [] History of TIA  [] Aphasia   [] Vissual changes   [] Weakness or numbness in arm   [] Weakness or numbness in leg Musculoskeletal:   [] Joint swelling   [] Joint pain   [] Low back pain Hematologic:  [] Easy bruising  [] Easy bleeding   [] Hypercoagulable state   [] Anemic Gastrointestinal:  [] Diarrhea   [] Vomiting  [] Gastroesophageal reflux/heartburn   [] Difficulty swallowing. Genitourinary:  [] Chronic kidney disease   [] Difficult urination  [] Frequent urination   [] Blood in urine Skin:  [] Rashes   [] Ulcers  Psychological:  [] History of anxiety   []  History of major depression.  Physical Examination  Vitals:   02/08/20 1425  BP: 132/80  Pulse: 80  Resp: 16  Weight: 129 lb 6.4 oz (58.7 kg)   Body mass index is 20.27 kg/m. Gen: WD/WN, NAD   CBC Lab Results  Component Value Date   WBC 12.1 (H) 08/30/2019   HGB 13.0 08/30/2019   HCT 36.7 08/30/2019   MCV 90.2 08/30/2019   PLT 375 08/30/2019    BMET    Component Value  Date/Time   NA 139 08/30/2019 1723   K 3.6 08/30/2019 1723   CL 105 08/30/2019 1723   CO2 24 08/30/2019 1723   GLUCOSE 113 (H) 08/30/2019 1723   BUN 11 08/30/2019 1723   CREATININE 0.64 08/30/2019 1723   CALCIUM 9.1 08/30/2019 1723   GFRNONAA >60 08/30/2019 1723   GFRAA >60 08/30/2019 1723   CrCl cannot be calculated (Patient's most recent lab result is older than the maximum 21 days allowed.).  COAG Lab Results  Component Value Date   INR 1.0 08/30/2019    Radiology No results found.  Assessment/Plan 1. Acute deep vein thrombosis (DVT) of popliteal vein of left lower extremity (HCC) Hypercoagulable panel is not yet reported  We did contact [facility] and they stated is is still pending.  I will review this with her when posted  Levora Dredge,  MD  02/08/2020 2:42 PM

## 2020-02-10 ENCOUNTER — Encounter (INDEPENDENT_AMBULATORY_CARE_PROVIDER_SITE_OTHER): Payer: Self-pay | Admitting: Vascular Surgery

## 2020-02-13 ENCOUNTER — Telehealth (INDEPENDENT_AMBULATORY_CARE_PROVIDER_SITE_OTHER): Payer: Self-pay | Admitting: Vascular Surgery

## 2020-02-13 NOTE — Telephone Encounter (Signed)
Called stating that she had injections in both legs last week in [place] ; but she is experiencing pain in her left leg where she previously had a dvt. Patient is still waiting on her lab results but would like to come in to get a [place] of her left leg if possible. Patient was last seen 02/08/20 with a visit with GS. LLE Ven DVT was done 01/04/20. Please advise.

## 2020-02-13 NOTE — Telephone Encounter (Signed)
She can come in with DVT study only.  If it is positive we will work her in.  If negative, she should follow up with office that gave her leg injections.

## 2020-02-13 NOTE — Telephone Encounter (Signed)
Per Sheppard Plumber NP, bring the patient in per the recommendations. See notes below.  Thank you

## 2020-02-13 NOTE — Telephone Encounter (Signed)
Please advise, patient called and requested to stop Xarelto on 01/11/20 and was given the ok.

## 2020-02-14 ENCOUNTER — Ambulatory Visit (INDEPENDENT_AMBULATORY_CARE_PROVIDER_SITE_OTHER): Payer: BC Managed Care – PPO

## 2020-02-14 ENCOUNTER — Other Ambulatory Visit: Payer: Self-pay

## 2020-02-14 ENCOUNTER — Other Ambulatory Visit (INDEPENDENT_AMBULATORY_CARE_PROVIDER_SITE_OTHER): Payer: Self-pay | Admitting: Nurse Practitioner

## 2020-02-14 DIAGNOSIS — Z86718 Personal history of other venous thrombosis and embolism: Secondary | ICD-10-CM | POA: Diagnosis not present

## 2020-02-14 DIAGNOSIS — M79605 Pain in left leg: Secondary | ICD-10-CM

## 2020-02-27 ENCOUNTER — Other Ambulatory Visit: Payer: Self-pay | Admitting: Podiatry

## 2020-03-01 LAB — MISC LABCORP TEST (SEND OUT): Labcorp test code: 503200

## 2020-03-06 ENCOUNTER — Other Ambulatory Visit: Payer: Self-pay | Admitting: Podiatry

## 2020-03-18 ENCOUNTER — Encounter: Payer: Self-pay | Admitting: Podiatry

## 2020-03-18 ENCOUNTER — Telehealth (INDEPENDENT_AMBULATORY_CARE_PROVIDER_SITE_OTHER): Payer: Self-pay | Admitting: Vascular Surgery

## 2020-03-18 MED ORDER — RIVAROXABAN 10 MG PO TABS: 10.0000 mg | ORAL_TABLET | Freq: Every day | ORAL | 1 refills | Status: DC

## 2020-03-18 NOTE — Telephone Encounter (Signed)
I spoke with the patient by phone.  We reviewed her recent hypercoagulable panel.  This is all within the normal range.  There were no outliers or abnormal tests noted.  She related that her next foot surgery is coming up February 3 and that she had discussed with Dr. Excell Seltzer DVT prophylaxis.  She does have Xarelto at home and has done well with Xarelto.  I believe we should begin 10 mg p.o. daily the day after surgery and we can continue that for a full 2 weeks postop.  I did offer to let her cut the pills in half however they are very odd shaped and she did not feel comfortable so I will call in the prescription for 10 mg pills.

## 2020-03-26 ENCOUNTER — Other Ambulatory Visit
Admission: RE | Admit: 2020-03-26 | Discharge: 2020-03-26 | Disposition: A | Discharge status: Home / Self Care | Payer: BC Managed Care – PPO | Source: Ambulatory Visit | Attending: Podiatry | Admitting: Podiatry

## 2020-03-26 ENCOUNTER — Other Ambulatory Visit: Payer: Self-pay

## 2020-03-26 DIAGNOSIS — Z01812 Encounter for preprocedural laboratory examination: Secondary | ICD-10-CM | POA: Insufficient documentation

## 2020-03-26 DIAGNOSIS — U071 COVID-19: Secondary | ICD-10-CM | POA: Insufficient documentation

## 2020-03-26 LAB — SARS CORONAVIRUS 2 (TAT 6-24 HRS): SARS Coronavirus 2: POSITIVE — AB

## 2020-04-09 ENCOUNTER — Other Ambulatory Visit: Payer: BC Managed Care – PPO | Attending: Podiatry

## 2020-04-11 ENCOUNTER — Ambulatory Visit: Payer: BC Managed Care – PPO | Admitting: Anesthesiology

## 2020-04-11 ENCOUNTER — Ambulatory Visit
Admission: RE | Admit: 2020-04-11 | Discharge: 2020-04-11 | Disposition: A | Discharge status: Home / Self Care | Payer: BC Managed Care – PPO | Attending: Podiatry | Admitting: Podiatry

## 2020-04-11 ENCOUNTER — Other Ambulatory Visit: Payer: Self-pay

## 2020-04-11 ENCOUNTER — Encounter: Payer: Self-pay | Admitting: Podiatry

## 2020-04-11 ENCOUNTER — Encounter
Admission: RE | Disposition: A | Discharge status: Home / Self Care | Payer: Self-pay | Source: Home / Self Care | Attending: Podiatry

## 2020-04-11 DIAGNOSIS — Z86718 Personal history of other venous thrombosis and embolism: Secondary | ICD-10-CM | POA: Insufficient documentation

## 2020-04-11 DIAGNOSIS — D6859 Other primary thrombophilia: Secondary | ICD-10-CM

## 2020-04-11 DIAGNOSIS — M21621 Bunionette of right foot: Secondary | ICD-10-CM | POA: Insufficient documentation

## 2020-04-11 DIAGNOSIS — M899 Disorder of bone, unspecified: Secondary | ICD-10-CM | POA: Diagnosis present

## 2020-04-11 DIAGNOSIS — M2011 Hallux valgus (acquired), right foot: Secondary | ICD-10-CM | POA: Insufficient documentation

## 2020-04-11 DIAGNOSIS — M2041 Other hammer toe(s) (acquired), right foot: Secondary | ICD-10-CM | POA: Insufficient documentation

## 2020-04-11 DIAGNOSIS — Z09 Encounter for follow-up examination after completed treatment for conditions other than malignant neoplasm: Secondary | ICD-10-CM

## 2020-04-11 HISTORY — PX: WEIL OSTEOTOMY: SHX5044

## 2020-04-11 HISTORY — DX: Acute embolism and thrombosis of unspecified deep veins of unspecified lower extremity: I82.409

## 2020-04-11 HISTORY — PX: HAMMER TOE SURGERY: SHX385

## 2020-04-11 HISTORY — PX: HALLUX VALGUS AUSTIN: SHX6623

## 2020-04-11 HISTORY — PX: OSTECTOMY: SHX6439

## 2020-04-11 LAB — POCT PREGNANCY, URINE: Preg Test, Ur: NEGATIVE

## 2020-04-11 SURGERY — CORRECTION, HALLUX VALGUS
Anesthesia: General | Site: Toe | Laterality: Right

## 2020-04-11 MED ORDER — RIVAROXABAN 10 MG PO TABS: 10.0000 mg | ORAL_TABLET | Freq: Every day | ORAL | 1 refills | Status: AC

## 2020-04-11 MED ORDER — SCOPOLAMINE 1 MG/3DAYS TD PT72
1.0000 | MEDICATED_PATCH | Freq: Once | TRANSDERMAL | Status: DC
  Administered 2020-04-11: 1.5 mg via TRANSDERMAL

## 2020-04-11 MED ORDER — OXYCODONE HCL 5 MG/5ML PO SOLN: 5.0000 mg | Freq: Once | ORAL | Status: DC | PRN

## 2020-04-11 MED ORDER — DEXAMETHASONE SODIUM PHOSPHATE 4 MG/ML IJ SOLN
INTRAMUSCULAR | Status: DC | PRN
  Administered 2020-04-11: 4 mg via INTRAVENOUS

## 2020-04-11 MED ORDER — PROMETHAZINE HCL 25 MG/ML IJ SOLN
6.2500 mg | INTRAMUSCULAR | Status: DC | PRN
  Administered 2020-04-11: 6.25 mg via INTRAVENOUS

## 2020-04-11 MED ORDER — MIDAZOLAM HCL 5 MG/5ML IJ SOLN
INTRAMUSCULAR | Status: DC | PRN
  Administered 2020-04-11: 2 mg via INTRAVENOUS

## 2020-04-11 MED ORDER — ONDANSETRON HCL 4 MG/2ML IJ SOLN
INTRAMUSCULAR | Status: DC | PRN
  Administered 2020-04-11: 4 mg via INTRAVENOUS

## 2020-04-11 MED ORDER — GLYCOPYRROLATE 0.2 MG/ML IJ SOLN
INTRAMUSCULAR | Status: DC | PRN
  Administered 2020-04-11: .1 mg via INTRAVENOUS

## 2020-04-11 MED ORDER — MEPERIDINE HCL 25 MG/ML IJ SOLN: 6.2500 mg | INTRAMUSCULAR | Status: DC | PRN

## 2020-04-11 MED ORDER — POVIDONE-IODINE 7.5 % EX SOLN: Freq: Once | CUTANEOUS | Status: AC

## 2020-04-11 MED ORDER — FENTANYL CITRATE (PF) 100 MCG/2ML IJ SOLN
INTRAMUSCULAR | Status: DC | PRN
  Administered 2020-04-11: 100 ug via INTRAVENOUS

## 2020-04-11 MED ORDER — LACTATED RINGERS IV SOLN: INTRAVENOUS | Status: DC

## 2020-04-11 MED ORDER — ONDANSETRON HCL 4 MG PO TABS: 4.0000 mg | ORAL_TABLET | Freq: Three times a day (TID) | ORAL | 0 refills | Status: AC | PRN

## 2020-04-11 MED ORDER — OXYCODONE-ACETAMINOPHEN 5-325 MG PO TABS: 1.0000 | ORAL_TABLET | Freq: Four times a day (QID) | ORAL | 0 refills | Status: AC | PRN

## 2020-04-11 MED ORDER — PROPOFOL 10 MG/ML IV BOLUS
INTRAVENOUS | Status: DC | PRN
Start: 2020-04-11 — End: 2020-04-11
  Administered 2020-04-11: 140 mg via INTRAVENOUS
  Administered 2020-04-11: 40 mg via INTRAVENOUS

## 2020-04-11 MED ORDER — ROPIVACAINE HCL 5 MG/ML IJ SOLN
INTRAMUSCULAR | Status: DC | PRN
  Administered 2020-04-11: 40 mL via EPIDURAL

## 2020-04-11 MED ORDER — CEFAZOLIN SODIUM-DEXTROSE 2-4 GM/100ML-% IV SOLN
2.0000 g | INTRAVENOUS | Status: AC
  Administered 2020-04-11: 2 g via INTRAVENOUS

## 2020-04-11 MED ORDER — AMISULPRIDE (ANTIEMETIC) 5 MG/2ML IV SOLN
10.0000 mg | Freq: Once | INTRAVENOUS | Status: AC
  Administered 2020-04-11: 10 mg via INTRAVENOUS

## 2020-04-11 MED ORDER — FENTANYL CITRATE (PF) 100 MCG/2ML IJ SOLN: 25.0000 ug | INTRAMUSCULAR | Status: DC | PRN

## 2020-04-11 MED ORDER — CEPHALEXIN 500 MG PO CAPS: 500.0000 mg | ORAL_CAPSULE | Freq: Three times a day (TID) | ORAL | 0 refills | Status: AC

## 2020-04-11 MED ORDER — ONDANSETRON HCL 4 MG/2ML IJ SOLN
4.0000 mg | Freq: Once | INTRAMUSCULAR | Status: AC
  Administered 2020-04-11: 4 mg via INTRAVENOUS

## 2020-04-11 MED ORDER — EPHEDRINE SULFATE 50 MG/ML IJ SOLN
INTRAMUSCULAR | Status: DC | PRN
  Administered 2020-04-11: 5 mg via INTRAVENOUS
  Administered 2020-04-11: 10 mg via INTRAVENOUS

## 2020-04-11 MED ORDER — LIDOCAINE HCL (CARDIAC) PF 100 MG/5ML IV SOSY
PREFILLED_SYRINGE | INTRAVENOUS | Status: DC | PRN
  Administered 2020-04-11: 20 mg via INTRATRACHEAL

## 2020-04-11 MED ORDER — OXYCODONE HCL 5 MG PO TABS: 5.0000 mg | ORAL_TABLET | Freq: Once | ORAL | Status: DC | PRN

## 2020-04-11 SURGICAL SUPPLY — 57 items
APL SKNCLS STERI-STRIP NONHPOA (GAUZE/BANDAGES/DRESSINGS) ×2
BENZOIN TINCTURE PRP APPL 2/3 (GAUZE/BANDAGES/DRESSINGS) ×3 IMPLANT
BIT DRILL CANN 1.7 (BIT) ×1 IMPLANT
BIT DRILL CANNULATED 2.0 (BIT) ×1 IMPLANT
BLADE MED AGGRESSIVE (BLADE) ×1 IMPLANT
BLADE OSC/SAGITTAL MD 5.5X18 (BLADE) ×1 IMPLANT
BLADE SURG 15 STRL LF DISP TIS (BLADE) ×2 IMPLANT
BLADE SURG 15 STRL SS (BLADE) ×3
BNDG CMPR 75X41 PLY HI ABS (GAUZE/BANDAGES/DRESSINGS) ×2
BNDG COHESIVE 4X5 TAN STRL (GAUZE/BANDAGES/DRESSINGS) ×3 IMPLANT
BNDG ESMARK 4X12 TAN STRL LF (GAUZE/BANDAGES/DRESSINGS) ×3 IMPLANT
BNDG GAUZE 4.5X4.1 6PLY STRL (MISCELLANEOUS) ×3 IMPLANT
BNDG STRETCH 4X75 STRL LF (GAUZE/BANDAGES/DRESSINGS) ×3 IMPLANT
BOOT STEPPER DURA MED (SOFTGOODS) ×1 IMPLANT
CANISTER SUCT 1200ML W/VALVE (MISCELLANEOUS) ×3 IMPLANT
CNTRSNK DRL 2.5X2.5X (MISCELLANEOUS) IMPLANT
CNTRSNK DRL 2.5X2X (MISCELLANEOUS) IMPLANT
COUNTERSINK 2.5 (MISCELLANEOUS) ×6
COVER LIGHT HANDLE UNIVERSAL (MISCELLANEOUS) ×6 IMPLANT
CUFF TOURN SGL QUICK 18X4 (TOURNIQUET CUFF) ×1 IMPLANT
DRAPE FLUOR MINI C-ARM 54X84 (DRAPES) ×3 IMPLANT
DURAPREP 26ML APPLICATOR (WOUND CARE) ×3 IMPLANT
ELECT REM PT RETURN 9FT ADLT (ELECTROSURGICAL) ×3
ELECTRODE REM PT RTRN 9FT ADLT (ELECTROSURGICAL) ×2 IMPLANT
FIXATION HAMMERTOE ANGLD 15MM (Toe) IMPLANT
GAUZE SPONGE 4X4 12PLY STRL (GAUZE/BANDAGES/DRESSINGS) ×3 IMPLANT
GAUZE XEROFORM 1X8 LF (GAUZE/BANDAGES/DRESSINGS) ×4 IMPLANT
GLOVE PI ULTRA LF STRL 7.5 (GLOVE) IMPLANT
GLOVE PI ULTRA NON LATEX 7.5 (GLOVE) ×2
GLOVE PROTEXIS LATEX SZ 7.5 (GLOVE) ×6 IMPLANT
GLOVE SURG LATEX 7.5 PF (GLOVE) IMPLANT
GOWN STRL REUS W/ TWL LRG LVL3 (GOWN DISPOSABLE) ×4 IMPLANT
GOWN STRL REUS W/TWL LRG LVL3 (GOWN DISPOSABLE) ×6
GUIDEWIRE .9 (WIRE) ×3 IMPLANT
HAMMERTOE ANGLED 15MM 5MM (Toe) ×3 IMPLANT
KIT DRILL HAMMERLOCK2 IMPLANT (BIT) ×1 IMPLANT
KIT TURNOVER KIT A (KITS) ×3 IMPLANT
NS IRRIG 500ML POUR BTL (IV SOLUTION) ×3 IMPLANT
PACK EXTREMITY ARMC (MISCELLANEOUS) ×3 IMPLANT
PENCIL SMOKE EVACUATOR (MISCELLANEOUS) ×3 IMPLANT
PIN BALLS 3/8 F/.045 WIRE (MISCELLANEOUS) ×2 IMPLANT
RASP SM TEAR CROSS CUT (RASP) ×1 IMPLANT
SCREW 2.5X22 HEADED (Screw) ×1 IMPLANT
SCREW CANN 10X2 CLR CD FT (Screw) IMPLANT
SCREW CANN HEADED 2.0X10 (Screw) ×3 IMPLANT
SCREW HEAD 12X2XCANN CLR CD FT (Screw) IMPLANT
SCREW HEADED 2.0X12 (Screw) ×3 IMPLANT
STOCKINETTE IMPERVIOUS LG (DRAPES) ×3 IMPLANT
STRIP CLOSURE SKIN 1/4X4 (GAUZE/BANDAGES/DRESSINGS) ×3 IMPLANT
SUT ETHILON 4-0 (SUTURE) ×6
SUT ETHILON 4-0 FS2 18XMFL BLK (SUTURE) ×4
SUT MNCRL 4-0 (SUTURE) ×6
SUT MNCRL 4-0 27XMFL (SUTURE) ×4
SUT VIC AB 3-0 SH 27 (SUTURE) ×6
SUT VIC AB 3-0 SH 27X BRD (SUTURE) IMPLANT
SUTURE ETHLN 4-0 FS2 18XMF BLK (SUTURE) IMPLANT
SUTURE MNCRL 4-0 27XMF (SUTURE) IMPLANT

## 2020-04-11 NOTE — Anesthesia Preprocedure Evaluation (Addendum)
Anesthesia Evaluation  Patient identified by MRN, date of birth, ID band Patient awake    Reviewed: Allergy & Precautions, H&P , NPO status , Patient's Chart, lab work & pertinent test results, reviewed documented beta blocker date and time   History of Anesthesia Complications (+) PONV and history of anesthetic complications  Airway Mallampati: II  TM Distance: >3 FB Neck ROM: full    Dental no notable dental hx.    Pulmonary neg pulmonary ROS,    Pulmonary exam normal breath sounds clear to auscultation       Cardiovascular Exercise Tolerance: Good negative cardio ROS   Rhythm:regular Rate:Normal     Neuro/Psych negative neurological ROS  negative psych ROS   GI/Hepatic negative GI ROS, Neg liver ROS,   Endo/Other  negative endocrine ROS  Renal/GU negative Renal ROS  negative genitourinary   Musculoskeletal   Abdominal   Peds  Hematology negative hematology ROS (+)   Anesthesia Other Findings   Reproductive/Obstetrics negative OB ROS                             Anesthesia Physical Anesthesia Plan  ASA: II  Anesthesia Plan: General   Post-op Pain Management: GA combined w/ Regional for post-op pain   Induction:   PONV Risk Score and Plan: 4 or greater and Propofol infusion, Ondansetron, Dexamethasone, Treatment may vary due to age or medical condition, Midazolam and Scopolamine patch - Pre-op  Airway Management Planned:   Additional Equipment:   Intra-op Plan:   Post-operative Plan:   Informed Consent: I have reviewed the patients History and Physical, chart, labs and discussed the procedure including the risks, benefits and alternatives for the proposed anesthesia with the patient or authorized representative who has indicated his/her understanding and acceptance.     Dental Advisory Given  Plan Discussed with: CRNA  Anesthesia Plan Comments:          Anesthesia Quick Evaluation

## 2020-04-11 NOTE — Progress Notes (Signed)
Assisted Almedia Balls, ANMD with right, ultrasound guided, popliteal/saphenous block. Side rails up, monitors on throughout procedure. See vital signs in flow sheet. Tolerated Procedure well.

## 2020-04-11 NOTE — Anesthesia Procedure Notes (Signed)
Procedure Name: LMA Insertion Date/Time: 04/11/2020 7:45 AM Performed by: Maree Krabbe, CRNA Pre-anesthesia Checklist: Patient identified, Emergency Drugs available, Suction available, Timeout performed and Patient being monitored Patient Re-evaluated:Patient Re-evaluated prior to induction Oxygen Delivery Method: Circle system utilized Preoxygenation: Pre-oxygenation with 100% oxygen Induction Type: IV induction LMA: LMA inserted LMA Size: 4.0 Number of attempts: 1 Placement Confirmation: positive ETCO2 and breath sounds checked- equal and bilateral Tube secured with: Tape Dental Injury: Teeth and Oropharynx as per pre-operative assessment

## 2020-04-11 NOTE — Op Note (Signed)
PODIATRY / FOOT AND ANKLE SURGERY OPERATIVE REPORT    SURGEON: Rosetta Posner, DPM  PRE-OPERATIVE DIAGNOSIS:  1.  Right hallux exostosis 2.  Right hallux valgus 3.  Right tailor's bunion 4.  Right second hammertoe contracture  POST-OPERATIVE DIAGNOSIS: Same   PROCEDURE(S): 1.  Right hallux exostectomy 2.  Right Austin bunionectomy 3.  Right tailor's bunionectomy with osteotomy 4.  Right second hammertoe repair consisting of PIPJ fusion with hammerlock implant and second MTPJ capsular release]  HEMOSTASIS: Right ankle tourniquet  ANESTHESIA: general with preoperative popliteal and saphenous nerve block  ESTIMATED BLOOD LOSS: 10 cc  FINDING(S): As described in report  PATHOLOGY/SPECIMEN(S): None  INDICATIONS:   Layli Capshaw is a 50 y.o. female who presents with pain to the right forefoot at the level of first metatarsal phalangeal joint, fifth metatarsal phalangeal joint, and second digit where it rubs against the hallux.  Patient previously had a tailor's bunionectomy with osteotomy on her left side with Dr. Orland Jarred and has done quite well without would like to proceed with surgery at this time on her right foot for similar procedures.  Patient has additional issues on the right side instead of just tailor's bunion patient also has hallux valgus contracture with second hammertoe contracture and slight medial deviation toe, hallux lateral distal phalanx exostosis.  Patient has performed conservative measures at this time and is exhausted conservative options with minimal relief.  Discussed all treatment options with patient both conservative and surgical attempts at correction clean potential risks and complications at this time patient is elected for surgical intervention as described above.  Patient has been cleared by vascular surgery due to previous DVT and will be taking Xarelto 10 mg after procedure for DVT prophylaxis.  DESCRIPTION: After obtaining full informed written  consent, the patient was brought back to the operating room and placed supine upon the operating table.  The patient received IV antibiotics prior to induction.  A pneumatic ankle tourniquet was applied about the patient's right ankle.  After obtaining adequate anesthesia, the patient was prepped and draped in the standard fashion.  An Esmarch bandage was used to exsanguinate the right lower extremity and pneumatic ankle tourniquet was inflated.  Attention was then directed to the right first metatarsophalangeal joint where a linear longitudinal incision was made along the course of the deformity over the first metatarsal phalangeal joint.  The incision was deepened to the subcutaneous tissues utilizing sharp and blunt dissection and care was taken to identify and retract all vital neurovascular structures no venous contributories were cauterized necessary.  At this time a capsulotomy was performed following the contour deformity at the first metatarsal phalangeal joint straight down to bone.  The capsule was then reflected medially and laterally thereby exposing the first metatarsal phalangeal joint at the operative site.  There appeared to be a large dorsal exostosis present which was resected with the sagittal bone saw and passed off the operative site.  The medial eminence was also resected and passed off the operative site.  A lateral release was then performed through the same incision while retracting the extensor tendon medially and the skin and subcutaneous tissue laterally.  The conjoined tendon of the adductor hallucis tendon was released along with the lateral capsular tissue.  At this time attention was then directed to the medial aspect of the first metatarsal head where an Eliberto Ivory type V osteotomy was performed leaving a longer plantar arm the dorsal arm.  The osteotomy was completed through and through and  the capital fragment was shifted laterally approximately 3 to 4 mm correcting the  intermetatarsal space angle.  At this time a guidewire for a 2.7 cannulated headed screw was then placed across the osteotomy site from dorsal to plantar with the appropriate orientation.  Using standard AO principles and techniques a 2.7 x 22 mm partially threaded cannulated screw was placed across the osteotomy site with excellent compression noted.  C-arm imaging was utilized to verify correct position which appeared excellent and the screw appeared to be of the appropriate size and length.  The medial overhang was then resected with a sagittal bone saw and passed off the operative site.  The surgical site was flushed with copious amounts normal sterile saline.  The capsular and periosteal tissues were then reapproximated well coapted with 3-0 Vicryl.  The subcutaneous tissues were approximated well coapted with 4-0 Monocryl and the subcuticular closure was performed with 4-0 Monocryl.    Attention was then directed to the distal lateral aspect of the distal phalanx of the right hallux.  A linear longitudinal incision was made over this area.  And blunt dissection was continued down to the level periosteum.  All venous contributories were cauterized as necessary and neurovascular structures were retracted for direct visualization of the base of the distal phalanx laterally.  A periosteal incision was then made and the periosteum was reflected dorsally and plantarly debris exposing the distal lateral base of the distal phalanx of the hallux.  The large exostosis was evaluated and resected with a combination of rongeur and paddle rasp.  This was performed under fluoroscopic guidance and exostectomy appeared to be completed overall.  The surgical site was flushed with copious amounts normal sterile saline and the skin was then reapproximated well coapted with 4-0 nylon.  C-arm imaging showed correction of the first metatarsal phalangeal joint area as well with excellent compression noted across the osteotomy site  and reduction of the intermetatarsal space angle and the hallux appeared to be in a very rectus position overall.  Attention was then directed to the second digit and second metatarsal phalangeal joint where a linear longitudinal incision was made over the PIPJ.  The incision was deepened to the subcutaneous tissues utilizing sharp and blunt dissection care was taken to identify and retract all vital neurovascular structures no venous contributories were cauterized as necessary and this was continued down to the second metatarsal phalangeal joint area.  At this time an extensor tenotomy and capsulotomy was performed over the PIPJ and a release of the collateral and suspensory ligaments was performed.  The extensor tendon was elevated from the proximal phalanx head thereby exposing the joint surface.  At this time a sagittal bone saw was used to resect the head of the proximal phalanx at the operative site and a rongeur and curette was used to resect the articular cartilage from the base of the middle phalanx.  Once this was completed utilizing standard techniques for hammerlock medium size 10 degree implant the 10 degree implant was then placed at the PIPJ with the appropriate orientation and appeared to fit well overall.  The second toe appeared to be in a fairly rectus position but appeared to have some slight drift medially.  At this time attention was then directed to the second metatarsal phalangeal joint where a medial capsulotomy was performed releasing the medial collateral ligament as well as suspensory ligament.  Once this was performed the second toe appeared to drift in a more lateral or rectus position overall.  With the foot loaded the toe appeared to sit excellently and appeared to be rectus.  The surgical sites were flushed with copious amounts normal sterile saline.  The extensor tendon was trimmed slightly to remove some redundancy to the area.  The extensor tendon was then reapproximated well  coapted with 3-0 Vicryl.  The skin at both surgical sites was then reapproximated well coapted with 4-0 nylon with horizontal mattress and simple type stitching.  Attention was then directed to the fifth metatarsal phalangeal joint area where a linear longitudinal incision was made following the course the deformity over the tailor's bunion area from the distal shaft of the fifth metatarsal to the proximal phalanx base of the fifth digit.  The incision was deepened to the subcutaneous tissues utilizing sharp and blunt dissection and care was taken to identify and retract all vital neurovascular structures and all venous contributories were cauterized necessary.  At this time a capsulotomy was performed over the fifth metatarsal phalangeal joint over the dorsal lateral aspect following the contour deformity.  The capsular tissue was reflected medially and laterally thereby exposing the fifth metatarsal phalangeal joint at the operative site.  The lateral eminence was then resected with sagittal bone saw and passed off the operative site.  At this time a distal Weil type tailors bunion osteotomy was performed slightly longer than the normal Weil type osteotomy.  Once the osteotomy was complete the capital fragment was shifted medially and also rotated slightly medially to remove the lateral prominence that still remain.  Once this appeared to be in the appropriate position 2 guidewires for 2.0 cannulated Paragon 28 screws were placed from dorsal plantar across the osteotomy site with the appropriate orientation.  C-arm imaging was utilized to verify correct position which appeared to be excellent.  Utilizing standard AO principles and techniques a 2.0 x 12 mm and a 2.0 x 10 mm partially-threaded cannulated screws were placed across the osteotomy site with excellent compression noted.  Lateral overhang was then resected and passed off the operative site.  The guidewires were then removed.  The surgical site was  flushed with copious amounts normal sterile saline.  The capsular and periosteal tissue was then reapproximated well coapted with 3-0 Vicryl, the subcutaneous tissue and subcuticular tissue was reapproximated well coapted with 4-0 Monocryl.  Final C-arm imaging was then taken of this area as well showing excellent reduction of the fourth intermetatarsal space angle as well as lateral deviation angle.  The lateral eminence appeared to be significantly resected and appeared to have a normal anatomic contour at this time.  The pneumatic ankle tourniquet was deflated and a prompt hyperemic response noted all digits right foot.  A postoperative dressing was then applied consisting of benzoin and Steri-Strips, 4 x 4 gauze, Kerlix, Kling, Ace wrap, and patient was placed in a tall walking boot.  Patient tolerated the procedure and anesthesia well was transferred to recovery room vital signs stable vascular status intact all toes the right foot.  Patient will be discharged home with the appropriate orders and follow-up instructions as well as medications.  Patient is to be seen within 1 week of surgical date for further evaluation and management.  Patient to call office if any further problems arise.  Patient also start Xarelto medication 24 hours after procedure.  COMPLICATIONS: None  CONDITION: Good, stable  Rosetta Posner, DPM

## 2020-04-11 NOTE — Transfer of Care (Signed)
Immediate Anesthesia Transfer of Care Note  Patient: Mia Church  Procedure(s) Performed: HALLUX VALGUS [place] (Right Foot) WEIL OSTEOTOMY (Right Foot) HAMMER TOE CORRECTION (Right Toe) CONDYLECTOMY/OSTECTOMY (Right Foot)  Patient Location: PACU  Anesthesia Type: General  Level of Consciousness: awake, alert  and patient cooperative  Airway and Oxygen Therapy: Patient Spontanous Breathing and Patient connected to supplemental oxygen  Post-op Assessment: Post-op Vital signs reviewed, Patient's Cardiovascular Status Stable, Respiratory Function Stable, Patent Airway and No signs of Nausea or vomiting  Post-op Vital Signs: Reviewed and stable  Complications: No complications documented.

## 2020-04-11 NOTE — Anesthesia Postprocedure Evaluation (Signed)
Anesthesia Post Note  Patient: Sadae Arrazola  Procedure(s) Performed: HALLUX VALGUS [place] (Right Foot) WEIL OSTEOTOMY (Right Foot) HAMMER TOE CORRECTION (Right Toe) CONDYLECTOMY/OSTECTOMY (Right Foot)     Patient location during evaluation: PACU Anesthesia Type: General Level of consciousness: awake and alert Pain management: pain level controlled Vital Signs Assessment: post-procedure vital signs reviewed and stable Respiratory status: spontaneous breathing, nonlabored ventilation, respiratory function stable and patient connected to nasal cannula oxygen Cardiovascular status: blood pressure returned to baseline and stable Postop Assessment: no apparent nausea or vomiting Anesthetic complications: no   No complications documented.  SCOURAS, Salvadore Dom

## 2020-04-11 NOTE — H&P (Signed)
HISTORY AND PHYSICAL INTERVAL NOTE:  04/11/2020  7:22 AM  Mia Church  has presented today for surgery, with the diagnosis of M20.11 HALLUX VALGUS RIGHT M20.41 HAMMERTOE RIGHT FOOT 2ND LONG PLANTARFLEXED METATARSAL RIGHT EXOSTOSIS RIGHT HALLUX TAILORS BUNION RIGHT.  The various methods of treatment have been discussed with the patient.  No guarantees were given.  After consideration of risks, benefits and other options for treatment, the patient has consented to surgery.  I have reviewed the patients' chart and labs.  PROCEDURE: RIGHT [place] BUNIONECTOMY, HALLUX EXOSTECTOMY, 2ND HAMMERTOE REPAIR WITH 2ND MTPJ CAPSULAR CORRECTION AND POSSIBLE WEIL OSTEOTOMY, TAILORS BUNIONECTOMY WITH OSTEOTOMY     A history and physical examination was performed in my office.  The patient was reexamined.  There have been no changes to this history and physical examination.  Rosetta Posner, DPM

## 2020-04-11 NOTE — Discharge Instructions (Signed)
[facility]  POST OPERATIVE INSTRUCTIONS FOR DR. TROXLER, DR. Ether GriffinsFOWLER, AND DR. Arilyn Brierley [facility]   1. Take your medication as prescribed.  Pain medication should be taken only as needed.  You may also use ibuprofen or Tylenol between pain medication doses.  Take antibiotic medication as prescribed until gone for the next week.  Also take DVT prophylaxis medication, Xarelto 10 mg, as prescribed by the vascular surgeon as directed, take 24 hours after surgery.  2. Keep the dressing clean, dry and intact.  3. Keep your foot elevated above the heart level for the first 48 hours.  Continue to elevate and ice to improve swelling thereafter.  4. Always wear your post-op shoe boot even when sleeping as it is acting as a cast or splint to hold your foot in the appropriate position.  Always use your crutches, knee scooter, wheelchair, other assistive device if you are to be non-weight bearing.  Recommend trying to stay off of your right foot is much as possible.  You may use your heel for ambulation only for short distances or transfers.  Try to stay off the front part of the foot as much as you can and if you do put pressure on the front part of the foot be aware that the toe positions can change and your osteotomies may not heal correctly as well as incisions.  5. Do not take a shower. Baths are permissible as long as the foot is kept out of the water.   6. Every hour you are awake:  - Bend your knee 15 times. - Massage calf 15 times  7. [facility] [phone]) if any of the following problems occur: - You develop a temperature or fever. - The bandage becomes saturated with blood. - Medication does not stop your pain. - Injury of the foot occurs. - Any symptoms of infection including redness, odor, or red streaks running from wound.  General Anesthesia, Adult, Care After This sheet gives you information about how to  care for yourself after your procedure. Your health care provider may also give you more specific instructions. If you have problems or questions, contact your health care provider. What can I expect after the procedure? After the procedure, the following side effects are common:  Pain or discomfort at the IV site.  Nausea.  Vomiting.  Sore throat.  Trouble concentrating.  Feeling cold or chills.  Feeling weak or tired.  Sleepiness and fatigue.  Soreness and body aches. These side effects can affect parts of the body that were not involved in surgery. Follow these instructions at home: For the time period you were told by your health care provider:  Rest.  Do not participate in activities where you could fall or become injured.  Do not drive or use machinery.  Do not drink alcohol.  Do not take sleeping pills or medicines that cause drowsiness.  Do not make important decisions or sign legal documents.  Do not take care of children on your own.   Eating and drinking  Follow any instructions from your health care provider about eating or drinking restrictions.  When you feel hungry, start by eating small amounts of foods that are soft and easy to digest (bland), such as toast. Gradually return to your regular diet.  Drink enough fluid to keep your urine pale yellow.  If you vomit, rehydrate by drinking water, juice, or clear broth. General instructions  If you have sleep  apnea, surgery and certain medicines can increase your risk for breathing problems. Follow instructions from your health care provider about wearing your sleep device: ? Anytime you are sleeping, including during daytime naps. ? While taking prescription pain medicines, sleeping medicines, or medicines that make you drowsy.  Have a responsible adult stay with you for the time you are told. It is important to have someone help care for you until you are awake and alert.  Return to your normal  activities as told by your health care provider. Ask your health care provider what activities are safe for you.  Take over-the-counter and prescription medicines only as told by your health care provider.  If you smoke, do not smoke without supervision.  Keep all follow-up visits as told by your health care provider. This is important. Contact a health care provider if:  You have nausea or vomiting that does not get better with medicine.  You cannot eat or drink without vomiting.  You have pain that does not get better with medicine.  You are unable to pass urine.  You develop a skin rash.  You have a fever.  You have redness around your IV site that gets worse. Get help right away if:  You have difficulty breathing.  You have chest pain.  You have blood in your urine or stool, or you vomit blood. Summary  After the procedure, it is common to have a sore throat or nausea. It is also common to feel tired.  Have a responsible adult stay with you for the time you are told. It is important to have someone help care for you until you are awake and alert.  When you feel hungry, start by eating small amounts of foods that are soft and easy to digest (bland), such as toast. Gradually return to your regular diet.  Drink enough fluid to keep your urine pale yellow.  Return to your normal activities as told by your health care provider. Ask your health care provider what activities are safe for you. This information is not intended to replace advice given to you by your health care provider. Make sure you discuss any questions you have with your health care provider. Document Revised: 10/26/2019 Document Reviewed: 05/25/2019 Elsevier Patient Education  2021 [employer]  Scopolamine skin patches REMOVE PATCH IN 72 HOURS AND WASH HANDS IMMEDIATELY What is this medicine? SCOPOLAMINE (skoe POL a meen) is used to prevent nausea and vomiting caused by motion sickness, anesthesia and  surgery. This medicine may be used for other purposes; ask your health care provider or pharmacist if you have questions. COMMON BRAND NAME(S): Transderm Scop What should I tell my health care provider before I take this medicine? They need to know if you have any of these conditions:  are scheduled to have a gastric secretion test  glaucoma  heart disease  kidney disease  liver disease  lung or breathing disease, like asthma  mental illness  prostate disease  seizures  stomach or intestine problems  trouble passing urine  an unusual or allergic reaction to scopolamine, atropine, other medicines, foods, dyes, or preservatives  pregnant or trying to get pregnant  breast-feeding How should I use this medicine? This medicine is for external use only. Follow the directions on the prescription label. Wear only 1 patch at a time. Choose an area behind the ear, that is clean, dry, hairless and free from any cuts or irritation. Wipe the area with a clean dry tissue. Peel off  the plastic backing of the skin patch, trying not to touch the adhesive side with your hands. Do not cut the patches. Firmly apply to the area you have chosen, with the metallic side of the patch to the skin and the tan-colored side showing. Once firmly in place, wash your hands well with soap and water. Do not get this medicine into your eyes. After removing the patch, wash your hands and the area behind your ear thoroughly with soap and water. The patch will still contain some medicine after use. To avoid accidental contact or ingestion by children or pets, fold the used patch in half with the sticky side together and throw away in the trash out of the reach of children and pets. If you need to use a second patch after you remove the first, place it behind the other ear. A special MedGuide will be given to you by the pharmacist with each prescription and refill. Be sure to read this information carefully each  time. Talk to your pediatrician regarding the use of this medicine in children. Special care may be needed. Overdosage: If you think you have taken too much of this medicine contact a poison control center or emergency room at once. NOTE: This medicine is only for you. Do not share this medicine with others. What if I miss a dose? This does not apply. This medicine is not for regular use. What may interact with this medicine?  alcohol  antihistamines for allergy cough and cold  atropine  certain medicines for anxiety or sleep  certain medicines for bladder problems like oxybutynin, tolterodine  certain medicines for depression like amitriptyline, fluoxetine, sertraline  certain medicines for stomach problems like dicyclomine, hyoscyamine  certain medicines for Parkinson's disease like benztropine, trihexyphenidyl  certain medicines for seizures like phenobarbital, primidone  general anesthetics like halothane, isoflurane, methoxyflurane, propofol  ipratropium  local anesthetics like lidocaine, pramoxine, tetracaine  medicines that relax muscles for surgery  phenothiazines like chlorpromazine, mesoridazine, prochlorperazine, thioridazine  narcotic medicines for pain  other belladonna alkaloids This list may not describe all possible interactions. Give your health care provider a list of all the medicines, herbs, non-prescription drugs, or dietary supplements you use. Also tell them if you smoke, drink alcohol, or use illegal drugs. Some items may interact with your medicine. What should I watch for while using this medicine? Limit contact with water while swimming and bathing because the patch may fall off. If the patch falls off, throw it away and put a new one behind the other ear. You may get drowsy or dizzy. Do not drive, use machinery, or do anything that needs mental alertness until you know how this medicine affects you. Do not stand or sit up quickly, especially if  you are an older patient. This reduces the risk of dizzy or fainting spells. Alcohol may interfere with the effect of this medicine. Avoid alcoholic drinks. Your mouth may get dry. Chewing sugarless gum or sucking hard candy, and drinking plenty of water may help. Contact your healthcare professional if the problem does not go away or is severe. This medicine may cause dry eyes and blurred vision. If you wear contact lenses, you may feel some discomfort. Lubricating drops may help. See your healthcare professional if the problem does not go away or is severe. If you are going to need surgery, an MRI, CT scan, or other procedure, tell your healthcare professional that you are using this medicine. You may need to remove the patch before  the procedure. What side effects may I notice from receiving this medicine? Side effects that you should report to your doctor or health care professional as soon as possible:  allergic reactions like skin rash, itching or hives; swelling of the face, lips, or tongue  blurred vision  changes in vision  confusion  dizziness  eye pain  fast, irregular heartbeat  hallucinations, loss of contact with reality  nausea, vomiting  pain or trouble passing urine  restlessness  seizures  skin irritation  stomach pain Side effects that usually do not require medical attention (report to your doctor or health care professional if they continue or are bothersome):  drowsiness  dry mouth  headache  sore throat This list may not describe all possible side effects. Call your doctor for medical advice about side effects. You may report side effects to FDA at 1-800-FDA-1088. Where should I keep my medicine? Keep out of the reach of children. Store at room temperature between 20 and 25 degrees C (68 and 77 degrees F). Keep this medicine in the foil package until ready to use. Throw away any unused medicine after the expiration date. NOTE: This sheet is a  summary. It may not cover all possible information. If you have questions about this medicine, talk to your doctor, pharmacist, or health care provider.  2021 Elsevier/Gold Standard (2017-04-30 16:14:46)

## 2020-04-12 ENCOUNTER — Encounter: Payer: Self-pay | Admitting: Podiatry

## 2020-05-30 ENCOUNTER — Other Ambulatory Visit: Payer: Self-pay | Admitting: Family Medicine

## 2020-05-30 ENCOUNTER — Other Ambulatory Visit: Payer: Self-pay | Admitting: Obstetrics and Gynecology

## 2020-05-30 DIAGNOSIS — Z1231 Encounter for screening mammogram for malignant neoplasm of breast: Secondary | ICD-10-CM

## 2020-07-09 ENCOUNTER — Other Ambulatory Visit: Payer: Self-pay

## 2020-07-09 ENCOUNTER — Ambulatory Visit
Admission: RE | Admit: 2020-07-09 | Discharge: 2020-07-09 | Disposition: A | Discharge status: Home / Self Care | Payer: BC Managed Care – PPO | Source: Ambulatory Visit | Attending: Family Medicine | Admitting: Family Medicine

## 2020-07-09 DIAGNOSIS — Z1231 Encounter for screening mammogram for malignant neoplasm of breast: Secondary | ICD-10-CM | POA: Diagnosis present

## 2021-04-14 ENCOUNTER — Other Ambulatory Visit: Payer: Self-pay | Admitting: Family Medicine

## 2021-04-14 DIAGNOSIS — Z1231 Encounter for screening mammogram for malignant neoplasm of breast: Secondary | ICD-10-CM

## 2021-05-27 ENCOUNTER — Ambulatory Visit
Admission: RE | Admit: 2021-05-27 | Discharge: 2021-05-27 | Disposition: A | Discharge status: Home / Self Care | Payer: BC Managed Care – PPO | Source: Ambulatory Visit | Attending: Family Medicine | Admitting: Family Medicine

## 2021-05-27 DIAGNOSIS — Z1231 Encounter for screening mammogram for malignant neoplasm of breast: Secondary | ICD-10-CM | POA: Insufficient documentation

## 2021-05-29 ENCOUNTER — Other Ambulatory Visit: Payer: Self-pay | Admitting: Family Medicine

## 2021-05-29 DIAGNOSIS — R928 Other abnormal and inconclusive findings on diagnostic imaging of breast: Secondary | ICD-10-CM

## 2021-05-29 DIAGNOSIS — N6489 Other specified disorders of breast: Secondary | ICD-10-CM

## 2021-06-04 ENCOUNTER — Ambulatory Visit
Admission: RE | Admit: 2021-06-04 | Discharge: 2021-06-04 | Disposition: A | Discharge status: Home / Self Care | Payer: BC Managed Care – PPO | Source: Ambulatory Visit | Attending: Family Medicine | Admitting: Family Medicine

## 2021-06-04 DIAGNOSIS — N6489 Other specified disorders of breast: Secondary | ICD-10-CM | POA: Insufficient documentation

## 2021-06-04 DIAGNOSIS — R928 Other abnormal and inconclusive findings on diagnostic imaging of breast: Secondary | ICD-10-CM | POA: Diagnosis present

## 2021-06-17 ENCOUNTER — Other Ambulatory Visit: Payer: BC Managed Care – PPO

## 2021-11-12 IMAGING — MG DIGITAL SCREENING BILAT W/ TOMO W/ CAD
8 series · 9 of 24 positions shown · non-contrast
Comparison: Previous exam(s).

CLINICAL DATA: Screening.

EXAM:
DIGITAL SCREENING BILATERAL MAMMOGRAM WITH TOMO AND CAD

[R CC synth-2D]
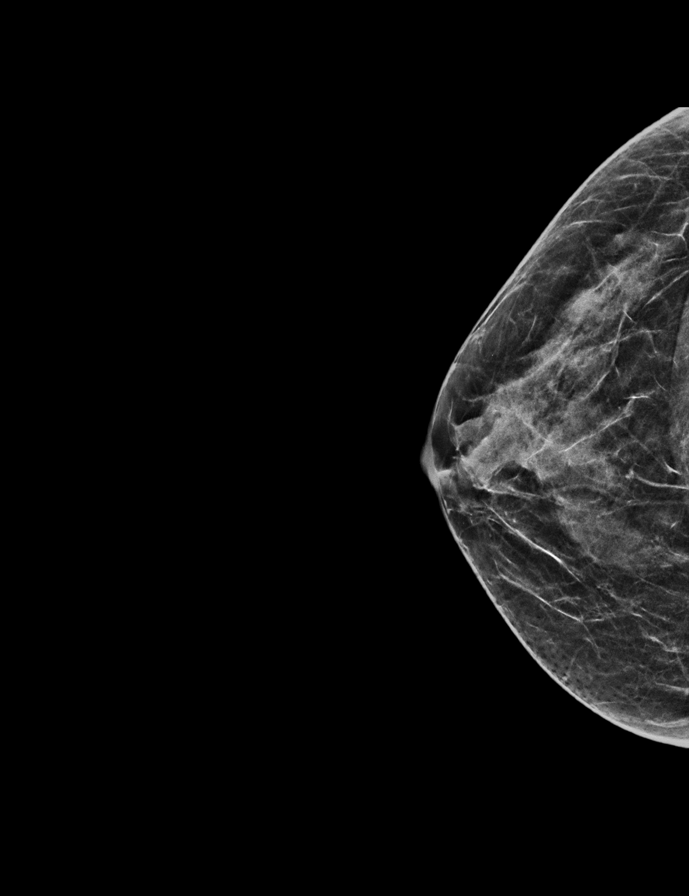

[R MLO synth-2D]
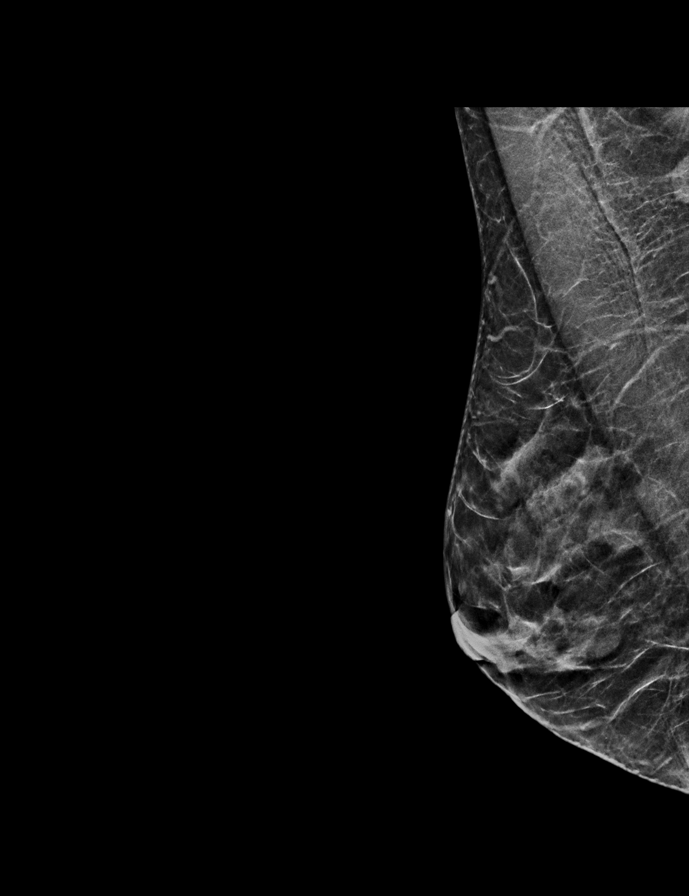

[L CC synth-2D]
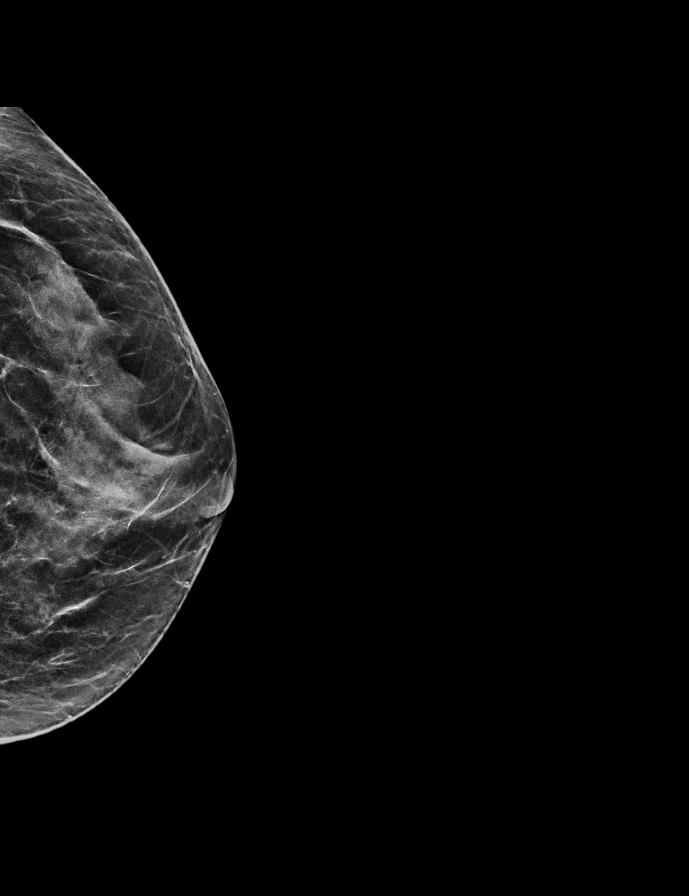

[L MLO synth-2D]
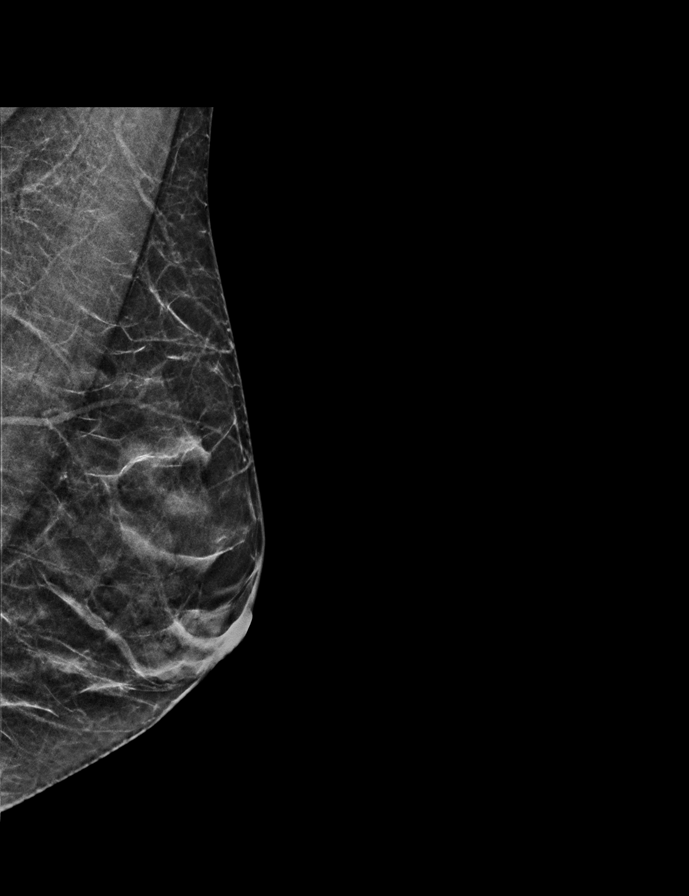

[R CC tomo · 2 of 40 frames shown]
[frame 13/40]
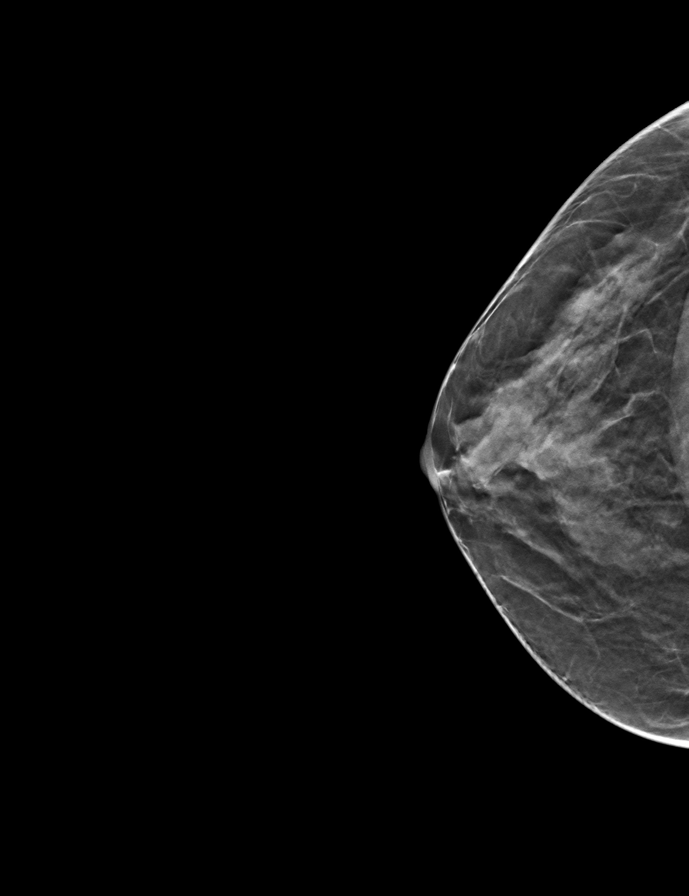
[frame 21/40]
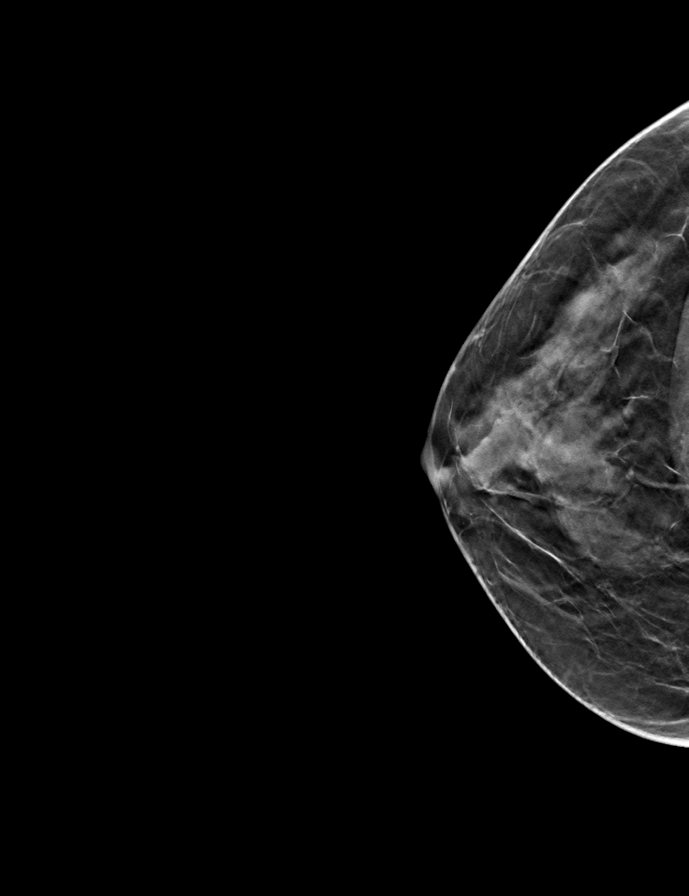

[L CC tomo · tomo slice 23/46.0]
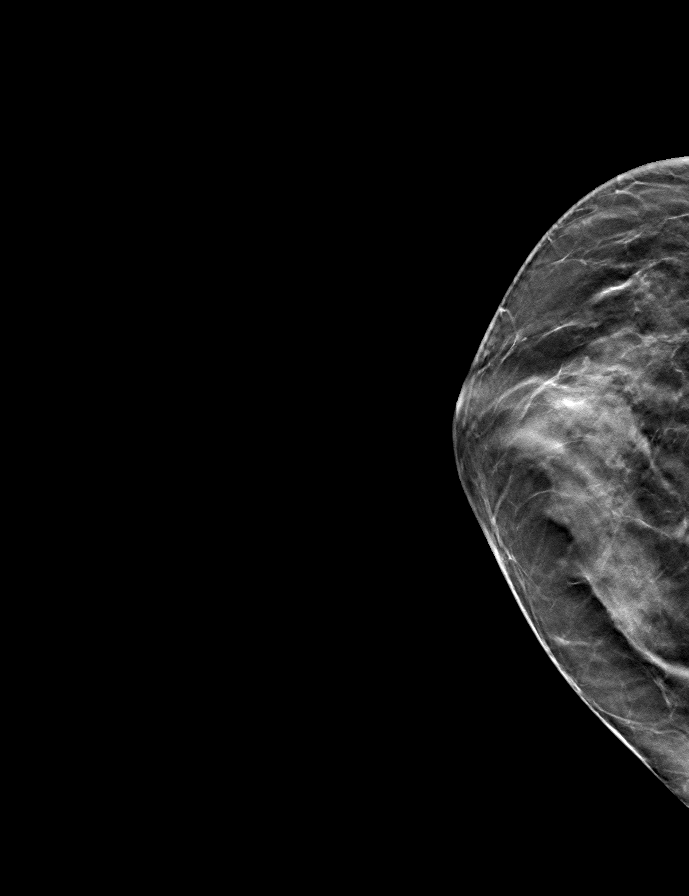

[R MLO tomo · tomo slice 23/45.0]
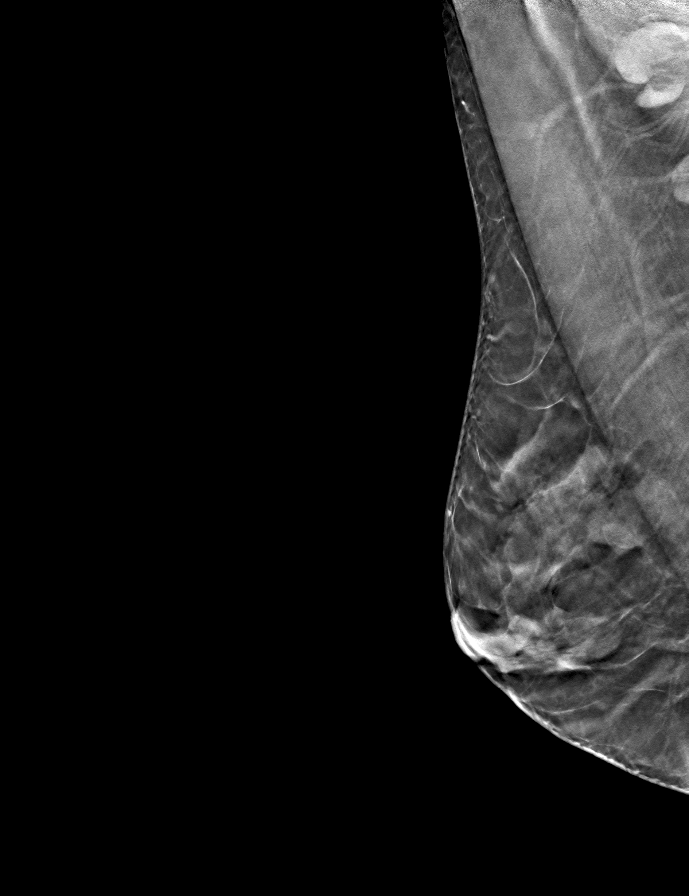

[L MLO tomo · tomo slice 21/42.0]
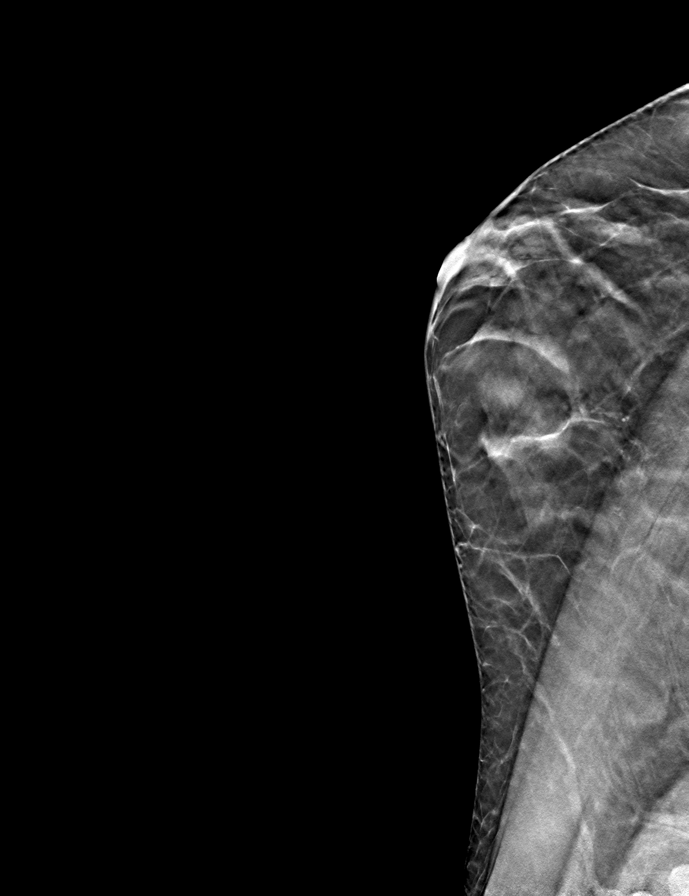

[9 of 24 positions shown; findings below may reference images not displayed]

ACR Breast Density Category c: The breast tissue is heterogeneously
dense, which may obscure small masses.
FINDINGS: In the right breast, a possible asymmetry warrants further
evaluation. In the left breast, no findings suspicious for
malignancy. Images were processed with CAD.
IMPRESSION: Further evaluation is suggested for possible asymmetry in the right
breast.

RECOMMENDATION:
Diagnostic mammogram and possibly ultrasound of the right breast.
(Code:EU-2-NNK)

The patient will be contacted regarding the findings, and additional
imaging will be scheduled.

BI-RADS CATEGORY  0: Incomplete. Need additional imaging evaluation
and/or prior mammograms for comparison.

## 2021-12-22 ENCOUNTER — Encounter (INDEPENDENT_AMBULATORY_CARE_PROVIDER_SITE_OTHER): Payer: Self-pay

## 2022-05-13 IMAGING — US US EXTREM LOW VENOUS*L*
1 series · 13 of 24 positions shown · non-contrast
Comparison: None.

CLINICAL DATA: Left lower extremity pain and swelling



[Series 1: us extrem low venous*left* · 0.07mm/px · 13 of 33 slices shown]
[im 1/33]
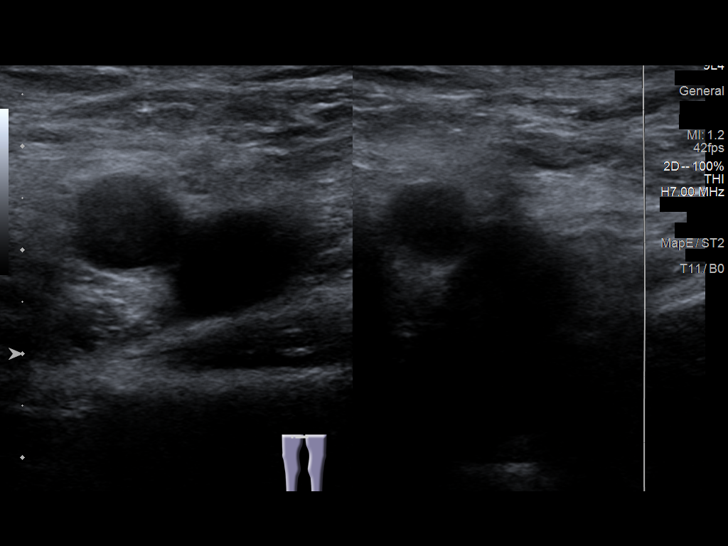
[im 3/33]
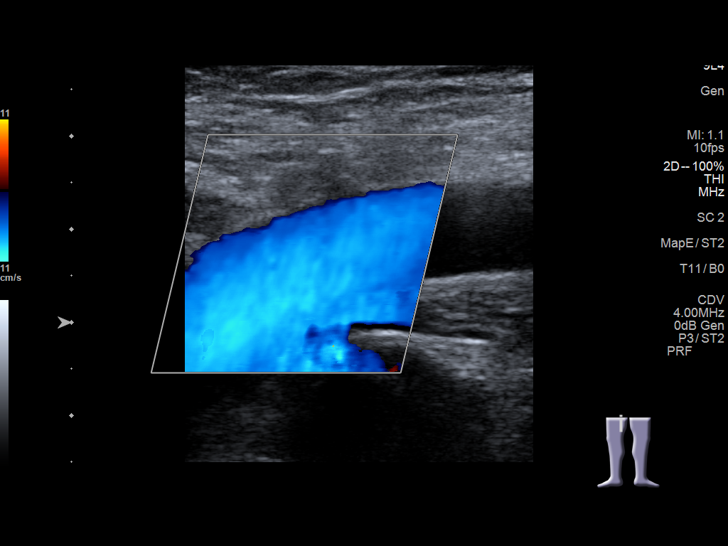
[im 6/33]
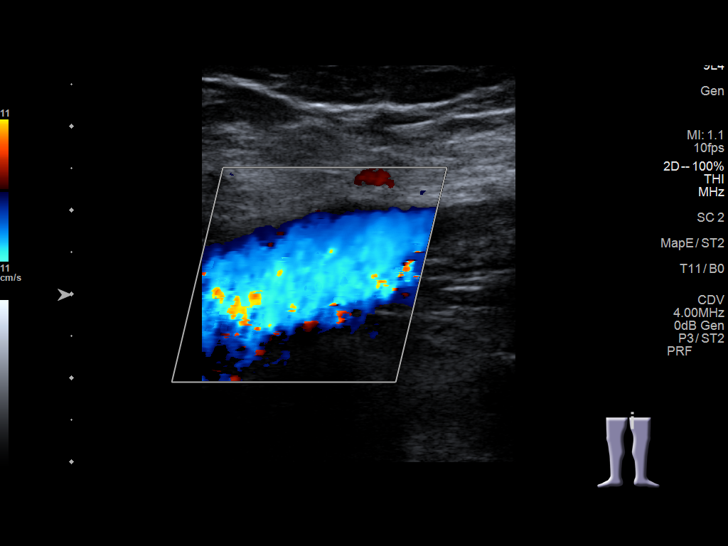
[im 9/33]
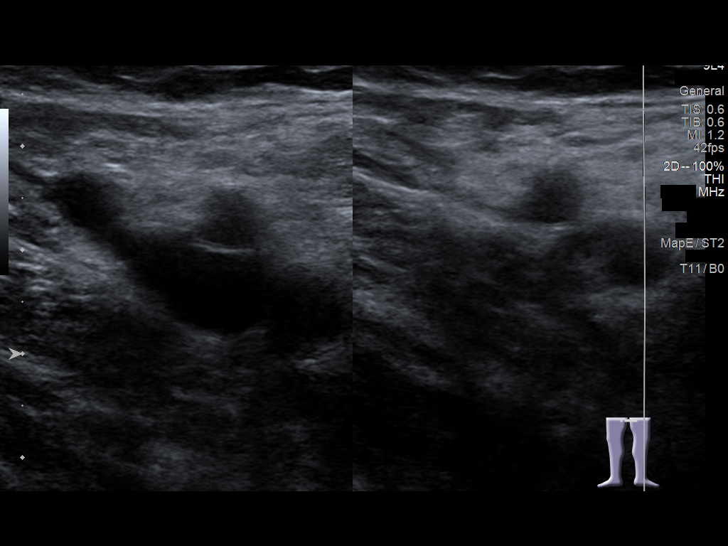
[im 12/33]
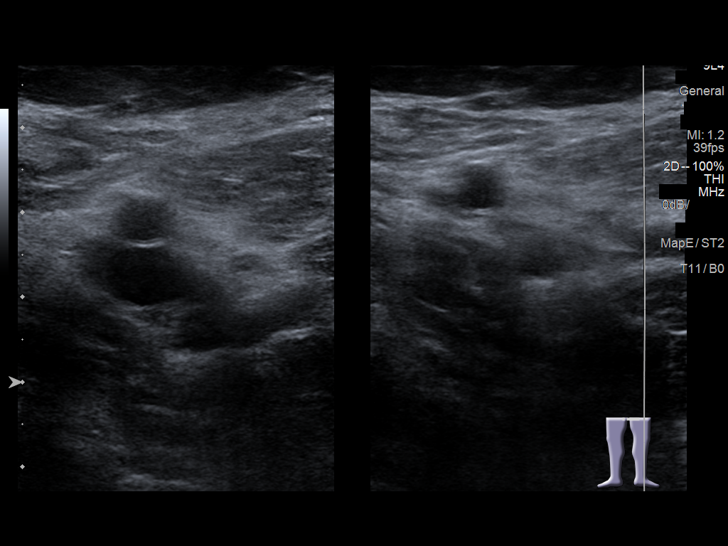
[im 14/33]
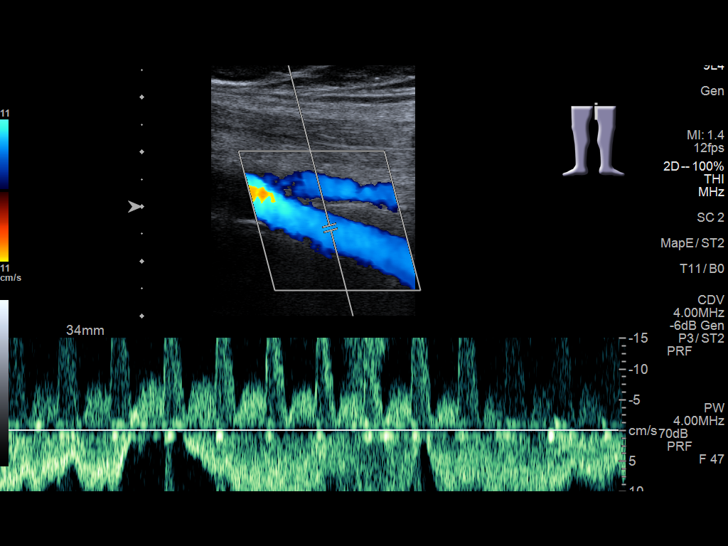
[im 17/33]
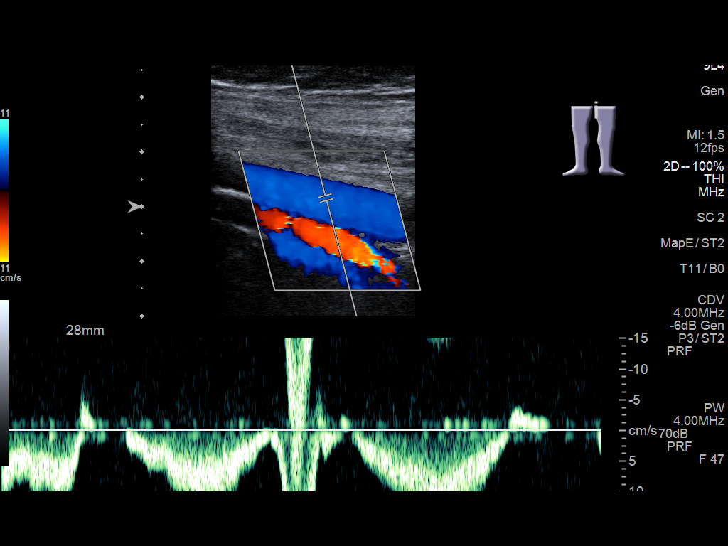
[im 19/33]
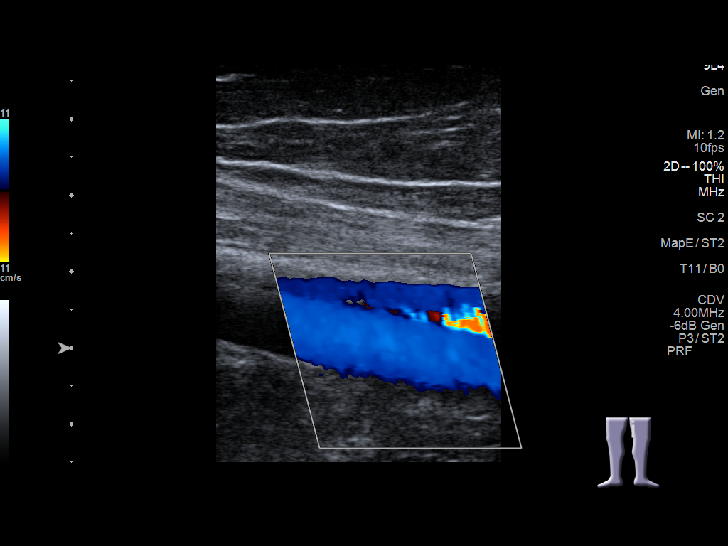
[im 21/33]
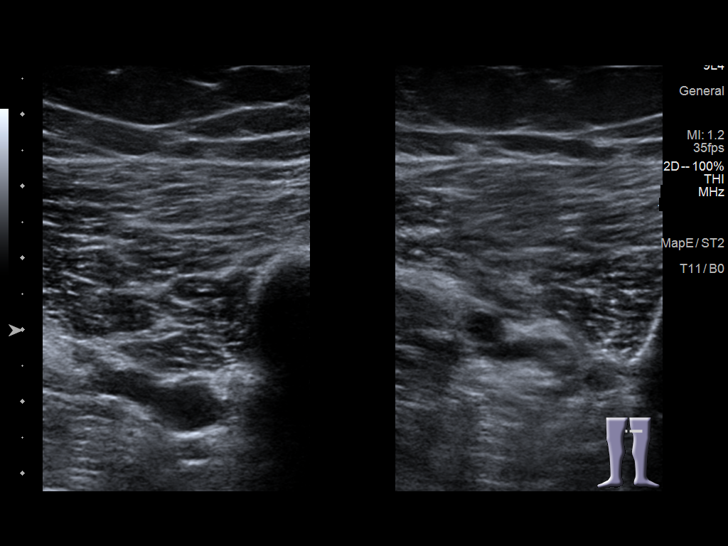
[im 24/33]
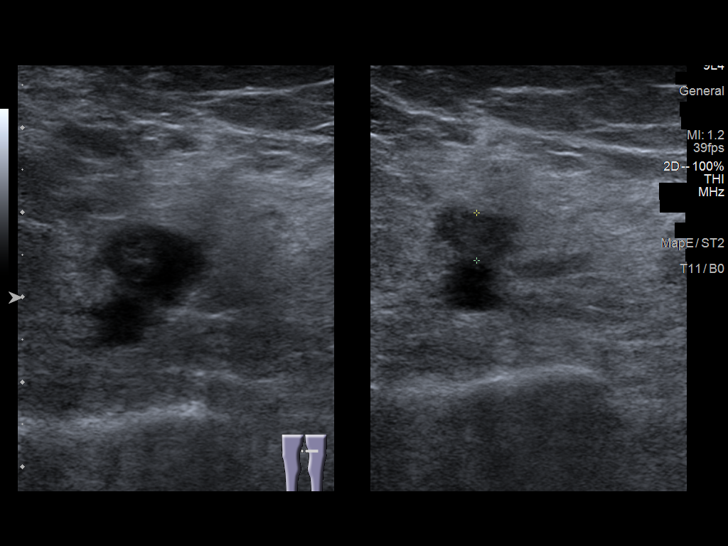
[im 27/33]
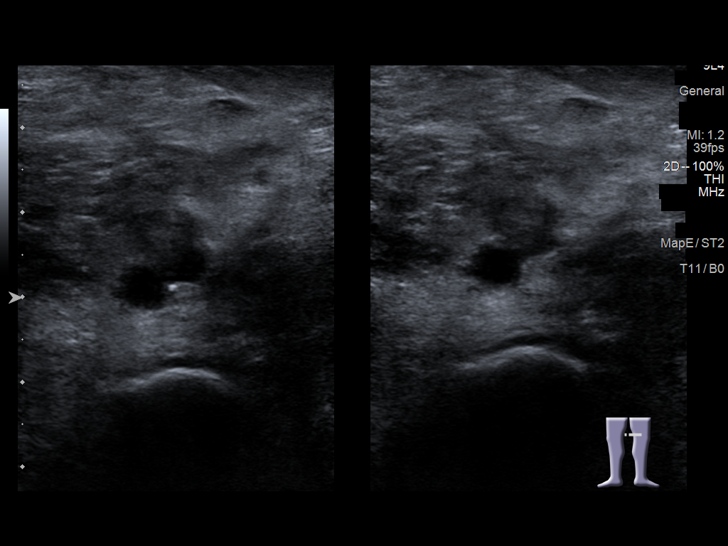
[im 30/33]
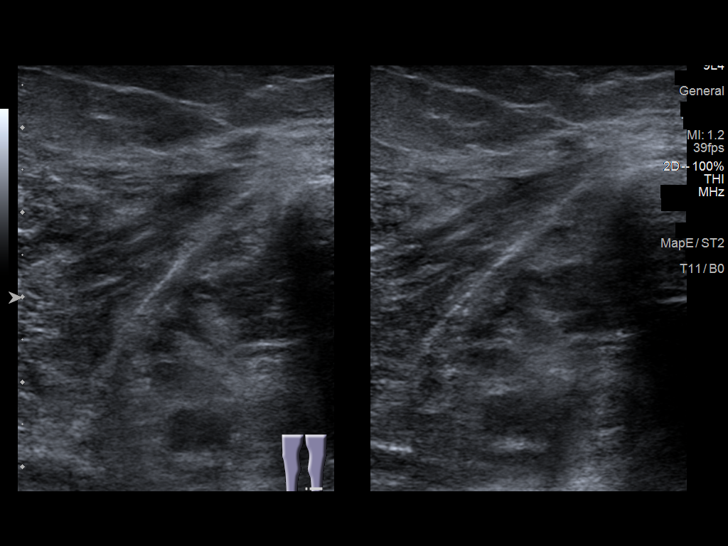
[im 33/33]
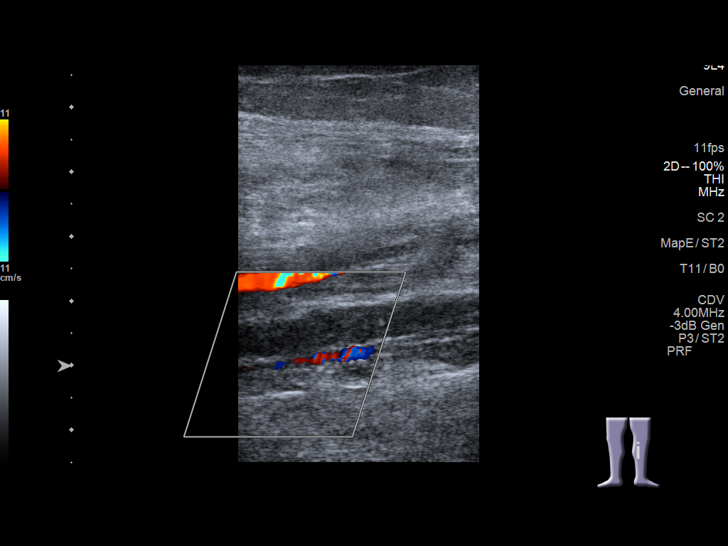

[13 of 24 positions shown; findings below may reference images not displayed]

FINDINGS: Contralateral Common Femoral Vein: Respiratory phasicity is normal
and symmetric with the symptomatic side. No evidence of thrombus.
Normal compressibility.

Common Femoral Vein: No evidence of thrombus. Normal
compressibility, respiratory phasicity and response to augmentation.

Saphenofemoral Junction: No evidence of thrombus. Normal
compressibility and flow on color Doppler imaging.

Profunda Femoral Vein: No evidence of thrombus. Normal
compressibility and flow on color Doppler imaging.

Femoral Vein: No evidence of thrombus. Normal compressibility,
respiratory phasicity and response to augmentation.

Popliteal Vein: Hypoechoic intraluminal thrombus. Vessel
noncompressible. Thrombus appears occlusive. No phasic flow.

Calf Veins: Popliteal thrombus appears to extend into the tibial and
peroneal veins of the calf.
IMPRESSION: Positive exam for occlusive left popliteal DVT extending into the
calf veins.

## 2023-02-02 ENCOUNTER — Other Ambulatory Visit: Payer: Self-pay | Admitting: Student

## 2023-02-02 DIAGNOSIS — E041 Nontoxic single thyroid nodule: Secondary | ICD-10-CM

## 2023-02-09 ENCOUNTER — Ambulatory Visit
Admission: RE | Admit: 2023-02-09 | Discharge: 2023-02-09 | Disposition: A | Discharge status: Home / Self Care | Payer: BC Managed Care – PPO | Source: Ambulatory Visit | Attending: Student

## 2023-02-09 DIAGNOSIS — E041 Nontoxic single thyroid nodule: Secondary | ICD-10-CM

## 2023-02-18 ENCOUNTER — Other Ambulatory Visit: Payer: Self-pay | Admitting: Student

## 2023-02-18 DIAGNOSIS — E041 Nontoxic single thyroid nodule: Secondary | ICD-10-CM

## 2023-06-21 ENCOUNTER — Other Ambulatory Visit: Payer: Self-pay | Admitting: Family Medicine

## 2023-06-21 DIAGNOSIS — Z1231 Encounter for screening mammogram for malignant neoplasm of breast: Secondary | ICD-10-CM

## 2023-07-16 ENCOUNTER — Other Ambulatory Visit: Payer: Self-pay | Admitting: Student

## 2023-07-16 DIAGNOSIS — E041 Nontoxic single thyroid nodule: Secondary | ICD-10-CM

## 2023-07-20 ENCOUNTER — Ambulatory Visit
Admission: RE | Admit: 2023-07-20 | Discharge: 2023-07-20 | Disposition: A | Discharge status: Home / Self Care | Payer: Self-pay | Source: Ambulatory Visit | Attending: Family Medicine | Admitting: Family Medicine

## 2023-07-20 DIAGNOSIS — Z1231 Encounter for screening mammogram for malignant neoplasm of breast: Secondary | ICD-10-CM | POA: Diagnosis present

## 2024-01-06 ENCOUNTER — Ambulatory Visit
Admission: RE | Admit: 2024-01-06 | Discharge: 2024-01-06 | Disposition: A | Source: Ambulatory Visit | Attending: Student

## 2024-01-06 DIAGNOSIS — E041 Nontoxic single thyroid nodule: Secondary | ICD-10-CM

## 2024-01-18 ENCOUNTER — Other Ambulatory Visit: Payer: Self-pay | Admitting: Student

## 2024-01-18 DIAGNOSIS — E041 Nontoxic single thyroid nodule: Secondary | ICD-10-CM
